# Patient Record
Sex: Male | Born: 1985 | Race: White | Hispanic: No | Marital: Single | Smoking: Never smoker
Health system: Southern US, Community
[De-identification: ages and names within clinical notes are randomized; demographics above are authoritative.]

## PROBLEM LIST (undated history)

## (undated) DIAGNOSIS — I10 Essential (primary) hypertension: Secondary | ICD-10-CM

## (undated) DIAGNOSIS — E7800 Pure hypercholesterolemia, unspecified: Secondary | ICD-10-CM

## (undated) DIAGNOSIS — L409 Psoriasis, unspecified: Secondary | ICD-10-CM

## (undated) HISTORY — PX: ORIF CLAVICLE FRACTURE: SUR924

---

## 2005-11-10 ENCOUNTER — Emergency Department: Payer: Self-pay | Admitting: Emergency Medicine

## 2005-11-19 ENCOUNTER — Ambulatory Visit: Payer: Self-pay | Admitting: Orthopaedic Surgery

## 2006-02-23 ENCOUNTER — Ambulatory Visit: Payer: Self-pay | Admitting: Orthopaedic Surgery
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
## 2014-12-15 DIAGNOSIS — I10 Essential (primary) hypertension: Secondary | ICD-10-CM | POA: Insufficient documentation

## 2017-10-16 ENCOUNTER — Other Ambulatory Visit: Payer: Self-pay

## 2017-10-16 ENCOUNTER — Ambulatory Visit
Admission: EM | Admit: 2017-10-16 | Discharge: 2017-10-16 | Disposition: A | Discharge status: Home / Self Care | Payer: Managed Care, Other (non HMO) | Attending: Family Medicine | Admitting: Family Medicine

## 2017-10-16 DIAGNOSIS — H6123 Impacted cerumen, bilateral: Secondary | ICD-10-CM

## 2017-10-16 DIAGNOSIS — H6691 Otitis media, unspecified, right ear: Secondary | ICD-10-CM

## 2017-10-16 DIAGNOSIS — J029 Acute pharyngitis, unspecified: Secondary | ICD-10-CM | POA: Diagnosis not present

## 2017-10-16 DIAGNOSIS — R0981 Nasal congestion: Secondary | ICD-10-CM

## 2017-10-16 HISTORY — DX: Psoriasis, unspecified: L40.9

## 2017-10-16 LAB — RAPID STREP SCREEN (MED CTR MEBANE ONLY): STREPTOCOCCUS, GROUP A SCREEN (DIRECT): NEGATIVE

## 2017-10-16 MED ORDER — AMOXICILLIN 875 MG PO TABS: 875.0000 mg | ORAL_TABLET | Freq: Two times a day (BID) | ORAL | 0 refills | Status: DC

## 2017-10-16 NOTE — Discharge Instructions (Signed)
Take medication as prescribed. Rest. Drink plenty of fluids.  ° °Follow up with your primary care physician this week as needed. Return to Urgent care for new or worsening concerns.  ° °

## 2017-10-16 NOTE — ED Triage Notes (Signed)
Pt with congestion x one week. He has noticed around 5:00 each evening, he gets a bad sore throat and right ear pain. No fever.

## 2017-10-16 NOTE — ED Provider Notes (Signed)
[facility] ____________________________________________  Time seen: Approximately 8:44 AM  I have reviewed the triage vital signs and the nursing notes.   HISTORY  Chief Complaint Otalgia and Sore Throat  HPI Jason Mcdonald is a 32 y.o. male presented for evaluation of 10 days of runny nose and nasal drainage with onset of sore throat and some right ear discomfort.  States sore throat this past Monday was moderate to severe, and sore throat currently is more mild.  Denies known sick contacts.  Reports right ear still with some pain now. Reports does have some seasonal allergies at baseline.  Has not been taking any allergy medication, has taken intermittent DayQuil and NyQuil.  Denies chest congestion.  States occasional cough.  Denies known fevers.  Overall continues to eat and drink.  Denies other aggravating or alleviating factors. Denies chest pain, shortness of breath, abdominal pain, or rash. Denies recent sickness. Denies recent antibiotic use.    Past Medical History:  Diagnosis Date  . Psoriasis     There are no active problems to display for this patient.   History reviewed. No pertinent surgical history.   No current facility-administered medications for this encounter.   Current Outpatient Medications:  .  amoxicillin (AMOXIL) 875 MG tablet, Take 1 tablet (875 mg total) by mouth 2 (two) times daily., Disp: 20 tablet, Rfl: 0 .  STELARA 90 MG/ML SOSY injection, , Disp: , Rfl:   Allergies Patient has no known allergies.  History reviewed. No pertinent family history.  Social History Social History   Tobacco Use  . Smoking status: Never Smoker  . Smokeless tobacco: Never Used  Substance Use Topics  . Alcohol use: Not Currently  . Drug use: Never    Review of Systems Constitutional: As above.  ENT: As above.  Cardiovascular: Denies chest pain. Respiratory: Denies shortness of breath. Gastrointestinal: No abdominal pain.  No nausea, no  vomiting.  No diarrhea.   Genitourinary: Negative for dysuria. Musculoskeletal: Negative for back pain. Skin: Negative for rash.  ____________________________________________   PHYSICAL EXAM:  VITAL SIGNS: ED Triage Vitals  Enc Vitals Group     BP 10/16/17 0815 (!) 152/94     Pulse Rate 10/16/17 0815 92     Resp 10/16/17 0815 18     Temp 10/16/17 0815 98.2 F (36.8 C)     Temp Source 10/16/17 0815 Oral     SpO2 10/16/17 0815 98 %     Weight 10/16/17 0817 220 lb (99.8 kg)     Height 10/16/17 0817 5\' 10"  (1.778 m)     Head Circumference --      Peak Flow --      Pain Score 10/16/17 0817 1     Pain Loc --      Pain Edu? --      Excl. in [place]? --    Constitutional: Alert and oriented. Well appearing and in no acute distress. Eyes: Conjunctivae are normal.  Head: Atraumatic. No sinus tenderness to palpation. No swelling. No erythema.  Ears: Left: Nontender, total cerumen impaction, post cerumen removal, normal canal, no erythema normal TM.  Right: Nontender, total cerumen impaction, post cerumen removal, canal clear, moderate TM erythema and dullness. No surrounding tenderness, swelling or erythema bilaterally.   Nose:Mild nasal congestion  Mouth/Throat: Mucous membranes are moist. Mild to moderate pharyngeal erythema. No tonsillar swelling or exudate.  Neck: No stridor.  No cervical spine tenderness to palpation. Hematological/Lymphatic/Immunilogical: No cervical lymphadenopathy. Cardiovascular: Normal rate, regular rhythm. Grossly  normal heart sounds.  Good peripheral circulation. Respiratory: Normal respiratory effort.  No retractions. No wheezes, rales or rhonchi. Good air movement.  Musculoskeletal: Ambulatory with steady gait.  Neurologic:  Normal speech and language. No gait instability. Skin:  Skin appears warm, dry and intact. No rash noted. Psychiatric: Mood and affect are normal. Speech and behavior are normal.  ___________________________________________    LABS (all labs ordered are listed, but only abnormal results are displayed)  Labs Reviewed  RAPID STREP SCREEN ([facility] ONLY)  CULTURE, GROUP A STREP [facility])   ____________________________________________  PROCEDURES Procedures  Bilateral cerumen impaction noted.  Wax is removed by curette removal and irrigation.. Instructions for home care to prevent wax buildup are given.   INITIAL IMPRESSION / ASSESSMENT AND PLAN / ED COURSE  Pertinent labs & imaging results that were available during my care of the patient were reviewed by me and considered in my medical decision making (see chart for details).  Well-appearing patient.  No acute distress.  Cerumen impaction bilaterally, removed with irrigation and curette.  Quick strep negative, will culture. Right otitis media will treat with oral amoxicillin. Encouraged rest, fluids and supportive care. Discussed indication, risks and benefits of medications with patient.  Discussed follow up with Primary care physician this week. Discussed follow up and return parameters including no resolution or any worsening concerns. Patient verbalized understanding and agreed to plan.   ____________________________________________   FINAL CLINICAL IMPRESSION(S) / ED DIAGNOSES  Final diagnoses:  Right otitis media, unspecified otitis media type  Pharyngitis, unspecified etiology  Nasal congestion  Bilateral impacted cerumen     ED Discharge Orders        Ordered    amoxicillin (AMOXIL) 875 MG tablet  2 times daily     10/16/17 4970       Note: This dictation was prepared with Dragon dictation along with smaller phrase technology. Any transcriptional errors that result from this process are unintentional.         Marylene Land, NP 10/16/17 1244

## 2017-10-18 LAB — CULTURE, GROUP A STREP (THRC)

## 2019-02-02 ENCOUNTER — Other Ambulatory Visit: Payer: Self-pay

## 2019-02-02 ENCOUNTER — Ambulatory Visit
Admission: EM | Admit: 2019-02-02 | Discharge: 2019-02-02 | Disposition: A | Discharge status: Home / Self Care | Payer: Managed Care, Other (non HMO) | Attending: Family Medicine | Admitting: Family Medicine

## 2019-02-02 ENCOUNTER — Encounter: Payer: Self-pay | Admitting: Emergency Medicine

## 2019-02-02 DIAGNOSIS — R1030 Lower abdominal pain, unspecified: Secondary | ICD-10-CM

## 2019-02-02 DIAGNOSIS — R197 Diarrhea, unspecified: Secondary | ICD-10-CM

## 2019-02-02 LAB — CBC WITH DIFFERENTIAL/PLATELET
Abs Immature Granulocytes: 0.05 10*3/uL (ref 0.00–0.07)
Basophils Absolute: 0.1 10*3/uL (ref 0.0–0.1)
Basophils Relative: 1 %
Eosinophils Absolute: 0.3 10*3/uL (ref 0.0–0.5)
Eosinophils Relative: 2 %
HCT: 46.2 % (ref 39.0–52.0)
Hemoglobin: 16.1 g/dL (ref 13.0–17.0)
Immature Granulocytes: 0 %
Lymphocytes Relative: 18 %
Lymphs Abs: 2.2 10*3/uL (ref 0.7–4.0)
MCH: 29.7 pg (ref 26.0–34.0)
MCHC: 34.8 g/dL (ref 30.0–36.0)
MCV: 85.1 fL (ref 80.0–100.0)
Monocytes Absolute: 1.1 10*3/uL — ABNORMAL HIGH (ref 0.1–1.0)
Monocytes Relative: 9 %
Neutro Abs: 8.7 10*3/uL — ABNORMAL HIGH (ref 1.7–7.7)
Neutrophils Relative %: 70 %
Platelets: 230 10*3/uL (ref 150–400)
RBC: 5.43 MIL/uL (ref 4.22–5.81)
RDW: 11.9 % (ref 11.5–15.5)
WBC: 12.4 10*3/uL — ABNORMAL HIGH (ref 4.0–10.5)
nRBC: 0 % (ref 0.0–0.2)

## 2019-02-02 LAB — COMPREHENSIVE METABOLIC PANEL
ALT: 41 U/L (ref 0–44)
AST: 24 U/L (ref 15–41)
Albumin: 4.3 g/dL (ref 3.5–5.0)
Alkaline Phosphatase: 67 U/L (ref 38–126)
Anion gap: 11 (ref 5–15)
BUN: 14 mg/dL (ref 6–20)
CO2: 22 mmol/L (ref 22–32)
Calcium: 9 mg/dL (ref 8.9–10.3)
Chloride: 100 mmol/L (ref 98–111)
Creatinine, Ser: 0.82 mg/dL (ref 0.61–1.24)
GFR calc Af Amer: >60 mL/min (ref >60)
GFR calc non Af Amer: >60 mL/min (ref >60)
Glucose, Bld: 100 mg/dL — ABNORMAL HIGH (ref 70–99)
Potassium: 3.8 mmol/L (ref 3.5–5.1)
Sodium: 133 mmol/L — ABNORMAL LOW (ref 135–145)
Total Bilirubin: 1.1 mg/dL (ref 0.3–1.2)
Total Protein: 8.2 g/dL — ABNORMAL HIGH (ref 6.5–8.1)

## 2019-02-02 MED ORDER — DIPHENOXYLATE-ATROPINE 2.5-0.025 MG PO TABS: 1.0000 | ORAL_TABLET | Freq: Four times a day (QID) | ORAL | 0 refills | Status: AC | PRN

## 2019-02-02 NOTE — ED Triage Notes (Signed)
Patient states he has had abdominal pain since Monday and has had wateryt diarrhea as well

## 2019-02-02 NOTE — Discharge Instructions (Signed)
Lots of fluids.  Medication as needed.  If persists, would recommend re-evaluation and stool studies.  Take care  Dr. Lacinda Axon

## 2019-02-02 NOTE — ED Provider Notes (Signed)
[facility]    CSN: [id number] Arrival date & time: 02/02/19  1515  History   Chief Complaint Chief Complaint  Patient presents with  . Abdominal Pain   HPI  33 year old male presents with diarrhea and associated abdominal pain.  Started Monday. Reports diarrhea and associated crampy abdominal pain. Reports approximately 2 loose stools and hour with lower abdominal pain. No fever. No medications tried. Eating bland diet currently. No vomiting. Has had some nausea. No recent travel. No recent bad food/dietary changes. No sick contacts at home. No other associated symptoms. No other complaints.  PMH, Surgical Hx, Family Hx, Social History reviewed and updated as below.  Past Medical History:  Diagnosis Date  . Psoriasis    History reviewed. No pertinent surgical history.   Home Medications    Prior to Admission medications   Medication Sig Start Date End Date Taking? Authorizing Provider  diphenoxylate-atropine (LOMOTIL) 2.5-0.025 MG tablet Take 1 tablet by mouth 4 (four) times daily as needed for diarrhea or loose stools. 02/02/19   Coral Spikes, DO  STELARA 90 MG/ML SOSY injection  08/14/17   [provider]    Family History Family History  Problem Relation Age of Onset  . Diabetes Mother     Social History Social History   Tobacco Use  . Smoking status: Never Smoker  . Smokeless tobacco: Never Used  Substance Use Topics  . Alcohol use: Not Currently  . Drug use: Never     Allergies   Patient has no known allergies.   Review of Systems Review of Systems  Constitutional: Negative for fever.  Gastrointestinal: Positive for abdominal pain, diarrhea and nausea.   Physical Exam Triage Vital Signs ED Triage Vitals  Enc Vitals Group     BP 02/02/19 1526 (!) 173/123     Pulse Rate 02/02/19 1526 97     Resp 02/02/19 1526 18     Temp 02/02/19 1526 98.7 F (37.1 C)     Temp Source 02/02/19 1526 Oral     SpO2 02/02/19 1526 96 %   Weight 02/02/19 1524 220 lb (99.8 kg)     Height 02/02/19 1524 5\' 9"  (1.753 m)     Head Circumference --      Peak Flow --      Pain Score 02/02/19 1523 1     Pain Loc --      Pain Edu? --      Excl. in [place]? --    Updated Vital Signs BP (!) 173/123 (BP Location: Left Arm)   Pulse 97   Temp 98.7 F (37.1 C) (Oral)   Resp 18   Ht 5\' 9"  (1.753 m)   Wt 99.8 kg   SpO2 96%   BMI 32.49 kg/m   Visual Acuity Right Eye Distance:   Left Eye Distance:   Bilateral Distance:    Right Eye Near:   Left Eye Near:    Bilateral Near:     Physical Exam Vitals signs and nursing note reviewed.  Constitutional:      General: He is not in acute distress.    Appearance: Normal appearance. He is obese.  HENT:     Head: Normocephalic and atraumatic.     Mouth/Throat:     Mouth: Mucous membranes are moist.     Pharynx: Oropharynx is clear.  Eyes:     General:        Right eye: No discharge.  Left eye: No discharge.     Conjunctiva/sclera: Conjunctivae normal.  Cardiovascular:     Rate and Rhythm: Normal rate and regular rhythm.  Pulmonary:     Effort: Pulmonary effort is normal.     Breath sounds: Normal breath sounds.  Abdominal:     General: There is no distension.     Palpations: Abdomen is soft.     Tenderness: There is no abdominal tenderness.  Neurological:     Mental Status: He is alert.  Psychiatric:        Mood and Affect: Mood normal.        Behavior: Behavior normal.    UC Treatments / Results  Labs (all labs ordered are listed, but only abnormal results are displayed) Labs Reviewed  COMPREHENSIVE METABOLIC PANEL - Abnormal; Notable for the following components:      Result Value   Sodium 133 (*)    Glucose, Bld 100 (*)    Total Protein 8.2 (*)    All other components within normal limits  CBC WITH DIFFERENTIAL/PLATELET - Abnormal; Notable for the following components:   WBC 12.4 (*)    Neutro Abs 8.7 (*)    Monocytes Absolute 1.1 (*)    All other  components within normal limits    EKG   Radiology No results found.  Procedures Procedures (including critical care time)  Medications Ordered in UC Medications - No data to display  Initial Impression / Assessment and Plan / UC Course  I have reviewed the triage vital signs and the nursing notes.  Pertinent labs & imaging results that were available during my care of the patient were reviewed by me and considered in my medical decision making (see chart for details).    33 year old male presents with diarrhea and associated abdominal pain. Labs stable. Likely infectious in origin. No indications for empiric antibiotic therapy at this time. Lomotil as needed. Fluids. Supportive care.  Final Clinical Impressions(s) / UC Diagnoses   Final diagnoses:  Diarrhea of presumed infectious origin  Lower abdominal pain     Discharge Instructions     Lots of fluids.  Medication as needed.  If persists, would recommend re-evaluation and stool studies.  Take care  Dr. Lacinda Axon    ED Prescriptions    Medication Sig Dispense Auth. Provider   diphenoxylate-atropine (LOMOTIL) 2.5-0.025 MG tablet Take 1 tablet by mouth 4 (four) times daily as needed for diarrhea or loose stools. 30 tablet Coral Spikes, DO     Controlled Substance Prescriptions [place] Controlled Substance Registry consulted? Not Applicable   Coral Spikes, DO 02/02/19 2200

## 2019-04-01 DIAGNOSIS — L409 Psoriasis, unspecified: Secondary | ICD-10-CM | POA: Insufficient documentation

## 2019-10-06 ENCOUNTER — Other Ambulatory Visit: Payer: Self-pay

## 2019-10-06 MED ORDER — STELARA 90 MG/ML ~~LOC~~ SOSY: 90.0000 mg | PREFILLED_SYRINGE | SUBCUTANEOUS | 0 refills | Status: DC

## 2019-10-18 ENCOUNTER — Ambulatory Visit (INDEPENDENT_AMBULATORY_CARE_PROVIDER_SITE_OTHER): Payer: BC Managed Care – PPO | Admitting: Dermatology

## 2019-10-18 ENCOUNTER — Other Ambulatory Visit: Payer: Self-pay

## 2019-10-18 DIAGNOSIS — L409 Psoriasis, unspecified: Secondary | ICD-10-CM | POA: Diagnosis not present

## 2019-10-18 MED ORDER — STELARA 90 MG/ML ~~LOC~~ SOSY: 90.0000 mg | PREFILLED_SYRINGE | SUBCUTANEOUS | 1 refills | Status: DC

## 2019-10-18 MED ORDER — STELARA 90 MG/ML ~~LOC~~ SOSY: 90.0000 mg | PREFILLED_SYRINGE | SUBCUTANEOUS | 3 refills | Status: DC

## 2019-10-18 NOTE — Progress Notes (Signed)
   Follow-Up Visit   Subjective  Jason Mcdonald is a 34 y.o. male who presents for the following: Psoriasis (Patient currently using Stelara and Mometasone sol. He has flared over the winter months on the shins and some in the scalp, but otherwise states that he is clear. ).  The following portions of the chart were reviewed this encounter and updated as appropriate: Tobacco  Allergies  Meds  Problems  Med Hx  Surg Hx  Fam Hx     Review of Systems: No other skin or systemic complaints.  Objective  Well appearing patient in no apparent distress; mood and affect are within normal limits.  A focused examination was performed including scalp, arms, B/L leg. Relevant physical exam findings are noted in the Assessment and Plan.  Objective  Scalp, B/L leg, elbows: Thick scale of the scalp. Plaques of the knees and pretibial areas.   Assessment & Plan  Psoriasis with recent flare possibly from cold weather and pandemic Scalp, B/L leg, elbows  BSA 8% - no hx of PsA, he flared over the winter months worse than he has in years. Continue Stelara 90mg /mL SQ Q3M and Mometasone solution to aa's QD-BID 5d/wk PRN flares.   If patient continues to flare while on treatment consider switching to a different biologic medication 9(Ilumya, Jason Mcdonald)  Lab ordered today.  He may get this done by his PCP  QuantiFERON-TB Gold Plus - Scalp, B/L leg, elbows  CBC with Differential/Platelets - Scalp, B/L leg, elbows  CMP - Scalp, B/L leg, elbows  ustekinumab (STELARA) 90 MG/ML SOSY injection - Scalp, B/L leg, elbows  Return in about 6 months (around 04/18/2020).   Luther Redo, CMA, am acting as scribe for [name], MD . Documentation: I have reviewed the above documentation for accuracy and completeness, and I agree with the above.  [name], MD

## 2019-10-19 ENCOUNTER — Encounter: Payer: Self-pay | Admitting: Dermatology

## 2020-03-27 ENCOUNTER — Other Ambulatory Visit: Payer: Self-pay

## 2020-03-27 MED ORDER — STELARA 90 MG/ML ~~LOC~~ SOSY: 90.0000 mg | PREFILLED_SYRINGE | SUBCUTANEOUS | 0 refills | Status: DC

## 2020-03-27 NOTE — Progress Notes (Signed)
Refill of Stelara RX to [facility] for follow up appointment next week.

## 2020-04-05 ENCOUNTER — Ambulatory Visit (INDEPENDENT_AMBULATORY_CARE_PROVIDER_SITE_OTHER): Payer: BC Managed Care – PPO | Admitting: Dermatology

## 2020-04-05 ENCOUNTER — Other Ambulatory Visit: Payer: Self-pay

## 2020-04-05 DIAGNOSIS — L409 Psoriasis, unspecified: Secondary | ICD-10-CM | POA: Diagnosis not present

## 2020-04-05 MED ORDER — CALCIPOTRIENE 0.005 % EX FOAM: CUTANEOUS | 6 refills | Status: DC

## 2020-04-05 MED ORDER — USTEKINUMAB 90 MG/ML ~~LOC~~ SOSY
90.0000 mg | PREFILLED_SYRINGE | Freq: Once | SUBCUTANEOUS | Status: AC
  Administered 2020-04-05: 90 mg via SUBCUTANEOUS

## 2020-04-05 NOTE — Progress Notes (Signed)
   Follow-Up Visit   Subjective  Jason Mcdonald is a 34 y.o. male who presents for the following: Psoriasis. Patient currently using Stelara 90mg /mL SQ Q12W, and Mometasone solution to aa's QD-BID PRN. He has noticed some hypopigmentation at previous areas of psoriasis flares which he would like checked today. Patient states that most summers his psoriasis clears, but this summer he continued to have flares.   The following portions of the chart were reviewed this encounter and updated as appropriate:  Tobacco  Allergies  Meds  Problems  Med Hx  Surg Hx  Fam Hx     Review of Systems:  No other skin or systemic complaints except as noted in HPI or Assessment and Plan.  Objective  Well appearing patient in no apparent distress; mood and affect are within normal limits.  A focused examination was performed including the extremities. Relevant physical exam findings are noted in the Assessment and Plan.  Objective  B/L leg, scalp, and elbows: Well-demarcated erythematous papules/plaques with silvery scale.   Assessment & Plan  Psoriasis -severe on systemic Stelara injections with recent flare of the legs.  Dyschromia of previous psoriasis areas B/L leg, scalp, and elbows No hx of PsA - labs WNL on 10/18/19  Discussed with patient that dyschromia is normal in psoriasis, and coloration will return over time.    Stelara 90mg /mL injected SQ into the R upper arm posterior. Patient tolerated injection well. AL, CMA  Lot number: [id number] Expiration date 09/2022  Continue Stelara 90mg /mL SQ Q12W. Start Calcipotriene foam to aa's QOD alternating with Mometasone solution QOD.   Consider switching to [facility] if patient continues to flare on treatment. Pamphlet given.   ustekinumab (STELARA) injection 90 mg - B/L leg, scalp, and elbows  Calcipotriene 0.005 % FOAM - B/L leg, scalp, and elbows  Other Related Medications ustekinumab (STELARA) 90 MG/ML SOSY injection  Return in about 6  months (around 10/04/2020) for psoriasis follow up .  Luther Redo, CMA, am acting as scribe for [name], MD .  Documentation: I have reviewed the above documentation for accuracy and completeness, and I agree with the above.  [name], MD

## 2020-04-06 ENCOUNTER — Encounter: Payer: Self-pay | Admitting: Dermatology

## 2020-04-10 ENCOUNTER — Telehealth: Payer: Self-pay

## 2020-04-10 NOTE — Telephone Encounter (Signed)
Calcipotriene foam and cream both not covered by pts insurance and would be expensive for pt out of pocket. Is there an alternative we can try to send?

## 2020-04-10 NOTE — Telephone Encounter (Signed)
If none of the forms of Calcipotriene are covered by insurance and if too expensive, there is no alternative. I would recommend he continue use of Mometasone.

## 2020-05-04 ENCOUNTER — Other Ambulatory Visit: Payer: Self-pay | Admitting: Dermatology

## 2020-06-06 ENCOUNTER — Other Ambulatory Visit: Payer: Self-pay | Admitting: Dermatology

## 2020-08-27 ENCOUNTER — Ambulatory Visit (INDEPENDENT_AMBULATORY_CARE_PROVIDER_SITE_OTHER): Payer: BC Managed Care – PPO | Admitting: Internal Medicine

## 2020-08-27 DIAGNOSIS — I10 Essential (primary) hypertension: Secondary | ICD-10-CM

## 2020-08-27 DIAGNOSIS — G4733 Obstructive sleep apnea (adult) (pediatric): Secondary | ICD-10-CM | POA: Diagnosis not present

## 2020-08-27 DIAGNOSIS — E669 Obesity, unspecified: Secondary | ICD-10-CM | POA: Diagnosis not present

## 2020-08-27 DIAGNOSIS — Z7189 Other specified counseling: Secondary | ICD-10-CM | POA: Diagnosis not present

## 2020-08-27 NOTE — Progress Notes (Signed)
[facility] [place], [place] [address]  Pulmonary Sleep Medicine   Office Visit Note  Patient Name: Jason Mcdonald DOB: [birth year] MRN [id number]    Chief Complaint: Obstructive Sleep Apnea visit  Brief History:  Jason Mcdonald is seen today for initial consultation The patient has a 3 month history of sleep apnea. Patient is using PAP nightly. It took a little while to adjust to the CPAP. The patient feels rested after sleeping with PAP.  The patient reports some benefit from PAP use. He was not sleepy before using CPAP but his wife would hear him snore and stop breathing.The Epworth Sleepiness Score is 0 out of 24. The patient does not take naps. The patient complains of the following: some leak  The compliance download shows excellent compliance with an average use time of 6.9 hours. The AHI is 3.7  The patient does not of limb movements disrupting sleep.  ROS  General: (-) fever, (-) chills, (-) night sweat Nose and Sinuses: (-) nasal stuffiness or itchiness, (-) postnasal drip, (-) nosebleeds, (-) sinus trouble. Mouth and Throat: (-) sore throat, (-) hoarseness. Neck: (-) swollen glands, (-) enlarged thyroid, (-) neck pain. Respiratory: - cough, - shortness of breath, - wheezing. Neurologic: - numbness, - tingling. Psychiatric: - anxiety, - depression   Current Medication: Outpatient Encounter Medications as of 08/27/2020  Medication Sig  . amLODipine-valsartan (EXFORGE) 5-160 MG tablet Take 1 tablet by mouth daily.  . Calcipotriene 0.005 % FOAM Apply to aa's psoriasis QOD alternating with Mometasone solution.  . diphenoxylate-atropine (LOMOTIL) 2.5-0.025 MG tablet Take 1 tablet by mouth 4 (four) times daily as needed for diarrhea or loose stools.  . mometasone (ELOCON) 0.1 % lotion APPLY A SMALL AMOUNT TO SKIN TWICE DAILY AS NEEDED  . STELARA 90 MG/ML SOSY injection   . STELARA 90 MG/ML SOSY injection INJECT 1 ML (90 MG TOTAL) UNDER THE SKIN EVERY 12  WEEKS AS DIRECTED FOR MAINTENANCE DOSING  . ustekinumab (STELARA) 90 MG/ML SOSY injection Inject 1 mL (90 mg total) into the skin as directed. Every 12 weeks for maintenance.  . ustekinumab (STELARA) 90 MG/ML SOSY injection Inject 1 mL (90 mg total) into the skin as directed. Every 12 weeks for maintenance.  . ustekinumab (STELARA) 90 MG/ML SOSY injection Inject 1 mL (90 mg total) into the skin as directed. Every 12 weeks for maintenance.   No facility-administered encounter medications on file as of 08/27/2020.    Surgical History: History reviewed. No pertinent surgical history.  Medical History: Past Medical History:  Diagnosis Date  . Psoriasis     Family History: Non contributory to the present illness  Social History: Social History   Socioeconomic History  . Marital status: Single    Spouse name: Not on file  . Number of children: Not on file  . Years of education: Not on file  . Highest education level: Not on file  Occupational History  . Not on file  Tobacco Use  . Smoking status: Never Smoker  . Smokeless tobacco: Never Used  Vaping Use  . Vaping Use: Never used  Substance and Sexual Activity  . Alcohol use: Not Currently  . Drug use: Never  . Sexual activity: Not on file  Other Topics Concern  . Not on file  Social History Narrative  . Not on file   Social Determinants of Health   Financial Resource Strain: Not on file  Food Insecurity: Not on file  Transportation Needs: Not on file  Physical Activity: Not on file  Stress: Not on file  Social Connections: Not on file  Intimate Partner Violence: Not on file    Vital Signs: Blood pressure (!) 158/89, pulse 83, temperature 97.7 F (36.5 C), temperature source Temporal, resp. rate 18, height 5\' 10"  (1.778 m), weight 251 lb (113.9 kg), SpO2 98 %.  Examination: General Appearance: The patient is well-developed, well-nourished, and in no distress. Neck Circumference: 48 Skin: Gross inspection of skin  unremarkable. Head: normocephalic, no gross deformities. Eyes: no gross deformities noted. ENT: ears appear grossly normal Neurologic: Alert and oriented. No involuntary movements.    EPWORTH SLEEPINESS SCALE:  Scale:  (0)= no chance of dozing; (1)= slight chance of dozing; (2)= moderate chance of dozing; (3)= high chance of dozing  Chance  Situtation    Sitting and reading: 0    Watching TV: 0    Sitting Inactive in public: 0    As a passenger in car: 0      Lying down to rest: 0    Sitting and talking: 0    Sitting quielty after lunch: 0    In a car, stopped in traffic: 0   TOTAL SCORE:   0 out of 24    SLEEP STUDIES:  1. HST 06/08/19 - RDI 27.7,  Low SpO2 81%   CPAP COMPLIANCE DATA:  Date Range: 07/28/20-08/26/20  Average Daily Use: 6.9 hours  Median Use: 7.3  Compliance for > 4 Hours: 90%  AHI: 3.7 respiratory events per hour  Days Used: 29/30  Mask Leak: 25.3  95th Percentile Pressure: 13.5         LABS: No results found for this or any previous visit (from the past 2160 hour(s)).  Radiology: No results found.  No results found.  No results found.    Assessment and Plan: Patient Active Problem List   Diagnosis Date Noted  . OSA (obstructive sleep apnea) 08/27/2020  . Essential hypertension 08/27/2020  . Obesity (BMI 35.0-39.9 without comorbidity) 08/27/2020  . Psoriasis 04/01/2019  . Hypertension, uncontrolled 12/15/2014      The patient does tolerate PAP and reports some benefit from PAP use. The patient is caring for his machine as recommended and advised to. The patient was also counselled on the importance of exercise to assist with weight control. The compliance has been excellent and the apnea is controlled  1. OSA (obstructive sleep apnea) continue excellent compliance  2. CPAP use counseling CPAP couseling-Discussed importance of adequate CPAP use as well as proper care and cleaning techniques of machine and all  supplies.  3. Essential hypertension Elevated in office. Continue to monitor and f/u with PCP.  4. Obesity (BMI 35.0-39.9 without comorbidity) Obesity Counseling: Had a lengthy discussion regarding patients BMI and weight issues. Patient was instructed on portion control as well as increased activity. Also discussed caloric restrictions with trying to maintain intake less than 2000 Kcal. Discussions were made in accordance with the 5As of weight management. Simple actions such as not eating late and if able to, taking a walk is suggested.   General Counseling: I have discussed the findings of the evaluation and examination with [facility].  I have also discussed any further diagnostic evaluation thatmay be needed or ordered today. Timon verbalizes understanding of the findings of todays visit. We also reviewed his medications today and discussed drug interactions and side effects including but not limited excessive drowsiness and altered mental states. We also discussed that there is always a risk not just  to him but also people around him. he has been encouraged to call the office with any questions or concerns that should arise related to todays visit.  No orders of the defined types were placed in this encounter.       I have personally obtained a history, examined the patient, evaluated laboratory and imaging results, formulated the assessment and plan and placed orders.  This patient was seen by Drema Dallas, PA-C in collaboration with Dr. Devona Konig as a part of collaborative care agreement.   Richelle Ito Saunders Glance, PhD, FAASM  Diplomate, American Board of Sleep Medicine    Allyne Gee, MD [facility] Diplomate ABMS Pulmonary and Critical Care Medicine Sleep medicine

## 2020-08-27 NOTE — Patient Instructions (Signed)

## 2020-10-04 ENCOUNTER — Ambulatory Visit: Payer: BC Managed Care – PPO | Admitting: Dermatology

## 2020-10-31 ENCOUNTER — Other Ambulatory Visit: Payer: Self-pay

## 2020-10-31 ENCOUNTER — Encounter: Payer: Self-pay | Admitting: Dermatology

## 2020-10-31 ENCOUNTER — Ambulatory Visit (INDEPENDENT_AMBULATORY_CARE_PROVIDER_SITE_OTHER): Payer: BC Managed Care – PPO | Admitting: Dermatology

## 2020-10-31 DIAGNOSIS — L409 Psoriasis, unspecified: Secondary | ICD-10-CM | POA: Diagnosis not present

## 2020-10-31 DIAGNOSIS — Z1283 Encounter for screening for malignant neoplasm of skin: Secondary | ICD-10-CM | POA: Diagnosis not present

## 2020-10-31 DIAGNOSIS — D229 Melanocytic nevi, unspecified: Secondary | ICD-10-CM | POA: Diagnosis not present

## 2020-10-31 DIAGNOSIS — L814 Other melanin hyperpigmentation: Secondary | ICD-10-CM

## 2020-10-31 DIAGNOSIS — B360 Pityriasis versicolor: Secondary | ICD-10-CM | POA: Diagnosis not present

## 2020-10-31 MED ORDER — KETOCONAZOLE 2 % EX SHAM: MEDICATED_SHAMPOO | CUTANEOUS | 6 refills | Status: AC

## 2020-10-31 MED ORDER — STELARA 90 MG/ML ~~LOC~~ SOSY: 90.0000 mg | PREFILLED_SYRINGE | SUBCUTANEOUS | 2 refills | Status: DC

## 2020-10-31 NOTE — Progress Notes (Signed)
   Follow-Up Visit   Subjective  Jason Mcdonald is a 35 y.o. male who presents for the following: Psoriasis (6 month check up. Elbows, scalp, knees. On Stelara. Controlled well. Uses Mometasone BID PRN. No hx of joint pain. On Stelara since 2020. Tolerating well, no injection site reactions or adverse reactions. ) and Rash (Dur: few months. Worse when hot/sweating. Neck. Gets bright pink when hot. Denies itch. Has been using Mometasone cream. ). The patient presents for Upper Body Skin Exam (UBSE) for skin cancer screening and mole check.  The following portions of the chart were reviewed this encounter and updated as appropriate:  Tobacco  Allergies  Meds  Problems  Med Hx  Surg Hx  Fam Hx     Review of Systems: No other skin or systemic complaints except as noted in HPI or Assessment and Plan.  Objective  Well appearing patient in no apparent distress; mood and affect are within normal limits.  A focused examination was performed including head, neck, arms, legs; trunk. Relevant physical exam findings are noted in the Assessment and Plan.  Objective  Right Elbow - Posterior: Guttate plaques at legs, minimal flakes and mild scale at scalp.  Objective  Neck - Anterior: Pink round patches without scale  Assessment & Plan  Psoriasis of the skin; severe on systemic biologic Stelara injections with great results. Right Elbow - Posterior Continue Stelara 90mg /mL SQ Q12W Continue Mometasone cream BID PRN  Other Related Procedures QuantiFERON-TB Gold Plus  Psoriasis - severe on systemic "biologic" treatment injections.  Psoriasis is a chronic non-curable, but treatable genetic/hereditary disease that may have other systemic features affecting other organ systems such as joints (Psoriatic Arthritis).  It is linked with heart disease, inflammatory bowel disease, non-alcoholic fatty liver disease, and depression. Significant skin psoriasis and/or psoriatic arthritis may have  significant symptoms and affects activities of daily activity and often benefits from systemic "biologic" injection treatments.  These "biologic" treatments have some potential side effects including immunosuppression and require pre-treatment laboratory screening and periodic laboratory monitoring and periodic in person evaluation and monitoring by the attending dermatologist physician.  Reordered Medications ustekinumab (STELARA) 90 MG/ML SOSY injection  Tinea versicolor Neck - Anterior Use Ketoconazole shampoo as body wash on neck, arms, torso 3x/week. Lather, leave on 5-10 minutes, rinse well. ketoconazole (NIZORAL) 2 % shampoo - Neck - Anterior  Melanocytic Nevi - Tan-brown and/or pink-flesh-colored symmetric macules and papules at torso - Benign appearing on exam today - Observation - Call clinic for new or changing moles - Recommend daily use of broad spectrum spf 30+ sunscreen to sun-exposed areas.   Lentigines - Scattered tan macules at face, arms, torso - Due to sun exposure - Benign-appering, observe - Recommend daily broad spectrum sunscreen SPF 30+ to sun-exposed areas, reapply every 2 hours as needed. - Call for any changes  Return in about 6 months (around 05/03/2021) for psoriasis recheck.   I, [name], CMA, am acting as scribe for [name], MD.  Documentation: I have reviewed the above documentation for accuracy and completeness, and I agree with the above.  [name], MD

## 2020-10-31 NOTE — Patient Instructions (Signed)
Reviewed risks of biologics including immunosuppression, infections, injection site reaction, and failure to improve condition. Goal is control of skin condition, not cure.  Some older biologics such as Humira and Enbrel may slightly increase risk of malignancy and may worsen congestive heart failure. The use of biologics requires long term medication management, including periodic office visits and monitoring of blood work.   

## 2020-11-02 ENCOUNTER — Encounter: Payer: Self-pay | Admitting: Dermatology

## 2020-12-14 NOTE — Progress Notes (Signed)
[facility] [place], [place] [address]  Pulmonary Sleep Medicine   Office Visit Note  Patient Name: Jason Mcdonald DOB: January 15, 1986 MRN [id number]    Chief Complaint: Obstructive Sleep Apnea visit  Brief History:  Jason Mcdonald is seen today for 4 month follow-up visit  to assure complete adjustment and comfort sleeping on APAP of 4 - 5 cmH2O. The patient has a 7 month history of sleep apnea. Patient is using PAP and feels rested after sleeping with PAP.  The patient reports blood pressure has improved since being on therapy. Reported sleepiness is  improved and the Epworth Sleepiness Score is 2 out of 24. The patient does not take naps. The patient complains of the following still having mask leaks 45.3 LPM small full face mask. Patient has not replaced seal since setup January 2022.  Recommending  monthly seal replacements and sign up for mailout supplies to keep regular replacement schedule.  The compliance download shows  compliance with an average use time of 6:56 hours   @ 92%. The AHI is 1.9  The patient does not complain of limb movements disrupting sleep.  ROS  General: (-) fever, (-) chills, (-) night sweat Nose and Sinuses: (-) nasal stuffiness or itchiness, (-) postnasal drip, (-) nosebleeds, (-) sinus trouble. Mouth and Throat: (-) sore throat, (-) hoarseness. Neck: (-) swollen glands, (-) enlarged thyroid, (-) neck pain. Respiratory: - cough, - shortness of breath, - wheezing. Neurologic: - numbness, - tingling. Psychiatric: - anxiety, - depression   Current Medication: Outpatient Encounter Medications as of 12/17/2020  Medication Sig   diphenoxylate-atropine (LOMOTIL) 2.5-0.025 MG tablet Take 1 tablet by mouth 4 (four) times daily as needed for diarrhea or loose stools.   ketoconazole (NIZORAL) 2 % shampoo 3 times a week lather from neck to thighs, leave on 5-10 minutes, rinse well   mometasone (ELOCON) 0.1 % lotion APPLY A SMALL AMOUNT TO SKIN  TWICE DAILY AS NEEDED   STELARA 90 MG/ML SOSY injection    STELARA 90 MG/ML SOSY injection INJECT 1 ML (90 MG TOTAL) UNDER THE SKIN EVERY 12 WEEKS AS DIRECTED FOR MAINTENANCE DOSING   ustekinumab (STELARA) 90 MG/ML SOSY injection Inject 1 mL (90 mg total) into the skin as directed. Every 12 weeks for maintenance.   ustekinumab (STELARA) 90 MG/ML SOSY injection Inject 1 mL (90 mg total) into the skin as directed. Every 12 weeks for maintenance.   ustekinumab (STELARA) 90 MG/ML SOSY injection Inject 1 mL (90 mg total) into the skin as directed. Every 12 weeks for maintenance.   [DISCONTINUED] amLODipine-valsartan (EXFORGE) 5-160 MG tablet Take 1 tablet by mouth daily.   No facility-administered encounter medications on file as of 12/17/2020.    Surgical History: Past Surgical History:  Procedure Laterality Date   ORIF CLAVICLE FRACTURE      Medical History: Past Medical History:  Diagnosis Date   Psoriasis     Family History: Non contributory to the present illness  Social History: Social History   Socioeconomic History   Marital status: Single    Spouse name: Not on file   Number of children: Not on file   Years of education: Not on file   Highest education level: Not on file  Occupational History   Not on file  Tobacco Use   Smoking status: Never   Smokeless tobacco: Never  Vaping Use   Vaping Use: Never used  Substance and Sexual Activity   Alcohol use: Not Currently   Drug  use: Never   Sexual activity: Not on file  Other Topics Concern   Not on file  Social History Narrative   Not on file   Social Determinants of Health   Financial Resource Strain: Not on file  Food Insecurity: Not on file  Transportation Needs: Not on file  Physical Activity: Not on file  Stress: Not on file  Social Connections: Not on file  Intimate Partner Violence: Not on file    Vital Signs: Blood pressure (!) 141/94, height 5\' 10"  (1.778 m), weight 257 lb (116.6 kg), SpO2 97  %.  Examination: General Appearance: The patient is well-developed, well-nourished, and in no distress. Neck Circumference: 48 Skin: Gross inspection of skin unremarkable. Head: normocephalic, no gross deformities. Eyes: no gross deformities noted. ENT: ears appear grossly normal Neurologic: Alert and oriented. No involuntary movements.    EPWORTH SLEEPINESS SCALE:  Scale:  (0)= no chance of dozing; (1)= slight chance of dozing; (2)= moderate chance of dozing; (3)= high chance of dozing  Chance  Situtation    Sitting and reading: 0    Watching TV: 1    Sitting Inactive in public: 0    As a passenger in car: 0      Lying down to rest: 1    Sitting and talking: 0    Sitting quielty after lunch: 0    In a car, stopped in traffic: 0   TOTAL SCORE:   2 out of 24    SLEEP STUDIES:    HST 06/08/19 - RDI 27.7,  Low SpO2 81%   CPAP COMPLIANCE DATA:  Date Range: 08/15/20 - 12/12/20  Average Daily Use: 6:56 hours  Median Use: 7:22 hours  Compliance for > 4 Hours: 92% days  AHI: 1.9 respiratory events per hour  Days Used: 118/120 days  Mask Leak: 45.3 Lpm  95th Percentile Pressure: 11.3 cmH2O         LABS: No results found for this or any previous visit (from the past 2160 hour(s)).  Radiology: No results found.  No results found.  No results found.    Assessment and Plan: Patient Active Problem List   Diagnosis Date Noted   OSA on CPAP 12/17/2020   CPAP use counseling 12/17/2020   OSA (obstructive sleep apnea) 08/27/2020   Essential hypertension 08/27/2020   Obesity (BMI 35.0-39.9 without comorbidity) 08/27/2020   Psoriasis 04/01/2019   Hypertension, uncontrolled 12/15/2014    1. OSA on CPAP The patient does tolerate PAP and reports definite benefit from PAP use. The patient was reminded how to clean equipment and advised to change supplies routinely. The patient was also counselled on weight loss and exercise. . The compliance is very  good. The AHI is 1.9. OSA- continue very good compliance with pap therapy. F/u one year.    2. CPAP use counseling CPAP Counseling: had a lengthy discussion with the patient regarding the importance of PAP therapy in management of the sleep apnea. Patient appears to understand the risk factor reduction and also understands the risks associated with untreated sleep apnea. Patient will try to make a good faith effort to remain compliant with therapy. Also instructed the patient on proper cleaning of the device including the water must be changed daily if possible and use of distilled water is preferred. Patient understands that the machine should be regularly cleaned with appropriate recommended cleaning solutions that do not damage the PAP machine for example given white vinegar and water rinses. Other methods such as ozone treatment  may not be as good as these simple methods to achieve cleaning.   3. Obesity (BMI 35.0-39.9 without comorbidity) Obesity Counseling: Had a lengthy discussion regarding patients BMI and weight issues. Patient was instructed on portion control as well as increased activity. Also discussed caloric restrictions with trying to maintain intake less than 2000 Kcal. Discussions were made in accordance with the 5As of weight management. Simple actions such as not eating late and if able to, taking a walk is suggested.   4. Essential hypertension Hypertension Counseling:   The following hypertensive lifestyle modification were recommended and discussed:  1. Limiting alcohol intake to less than 1 oz/day of ethanol:(24 oz of beer or 8 oz of wine or 2 oz of 100-proof whiskey). 2. Take baby ASA 81 mg daily. 3. Importance of regular aerobic exercise and losing weight. 4. Reduce dietary saturated fat and cholesterol intake for overall cardiovascular health. 5. Maintaining adequate dietary potassium, calcium, and magnesium intake. 6. Regular monitoring of the blood pressure. 7. Reduce  sodium intake to less than 100 mmol/day (less than 2.3 gm of sodium or less than 6 gm of sodium choride)     He saw his primary care provider today and they adjusted his blood pressure medication.   General Counseling: I have discussed the findings of the evaluation and examination with [facility].  I have also discussed any further diagnostic evaluation thatmay be needed or ordered today. Holston verbalizes understanding of the findings of todays visit. We also reviewed his medications today and discussed drug interactions and side effects including but not limited excessive drowsiness and altered mental states. We also discussed that there is always a risk not just to him but also people around him. he has been encouraged to call the office with any questions or concerns that should arise related to todays visit.  No orders of the defined types were placed in this encounter.       I have personally obtained a history, examined the patient, evaluated laboratory and imaging results, formulated the assessment and plan and placed orders.    This patient was seen today by Tressie Ellis, PA-C in collaboration with Dr. Devona Konig.   Allyne Gee, MD [facility] Diplomate ABMS Pulmonary and Critical Care Medicine Sleep medicine

## 2020-12-17 ENCOUNTER — Ambulatory Visit (INDEPENDENT_AMBULATORY_CARE_PROVIDER_SITE_OTHER): Payer: BC Managed Care – PPO | Admitting: Internal Medicine

## 2020-12-17 VITALS — BP 141/94 | Ht 70.0 in | Wt 257.0 lb

## 2020-12-17 DIAGNOSIS — Z9989 Dependence on other enabling machines and devices: Secondary | ICD-10-CM

## 2020-12-17 DIAGNOSIS — Z7189 Other specified counseling: Secondary | ICD-10-CM | POA: Diagnosis not present

## 2020-12-17 DIAGNOSIS — G4733 Obstructive sleep apnea (adult) (pediatric): Secondary | ICD-10-CM | POA: Diagnosis not present

## 2020-12-17 DIAGNOSIS — I10 Essential (primary) hypertension: Secondary | ICD-10-CM

## 2020-12-17 DIAGNOSIS — E669 Obesity, unspecified: Secondary | ICD-10-CM | POA: Diagnosis not present

## 2020-12-17 NOTE — Patient Instructions (Signed)

## 2020-12-21 DIAGNOSIS — E781 Pure hyperglyceridemia: Secondary | ICD-10-CM | POA: Insufficient documentation

## 2021-05-01 ENCOUNTER — Other Ambulatory Visit: Payer: Self-pay

## 2021-05-01 ENCOUNTER — Encounter: Payer: Self-pay | Admitting: Dermatology

## 2021-05-01 ENCOUNTER — Ambulatory Visit (INDEPENDENT_AMBULATORY_CARE_PROVIDER_SITE_OTHER): Payer: BC Managed Care – PPO | Admitting: Dermatology

## 2021-05-01 DIAGNOSIS — Z1283 Encounter for screening for malignant neoplasm of skin: Secondary | ICD-10-CM | POA: Diagnosis not present

## 2021-05-01 DIAGNOSIS — D1800 Hemangioma unspecified site: Secondary | ICD-10-CM

## 2021-05-01 DIAGNOSIS — L409 Psoriasis, unspecified: Secondary | ICD-10-CM

## 2021-05-01 DIAGNOSIS — D229 Melanocytic nevi, unspecified: Secondary | ICD-10-CM

## 2021-05-01 MED ORDER — STELARA 90 MG/ML ~~LOC~~ SOSY: 90.0000 mg | PREFILLED_SYRINGE | SUBCUTANEOUS | 1 refills | Status: DC

## 2021-05-01 MED ORDER — MOMETASONE FUROATE 0.1 % EX SOLN: CUTANEOUS | 5 refills | Status: AC

## 2021-05-01 NOTE — Progress Notes (Signed)
   Follow-Up Visit   Subjective  Jason Mcdonald is a 35 y.o. male who presents for the following: Psoriasis (Scalp, legs, 63m f/u Stelara sq injections q 12 weeks, Mometasone qd).  The following portions of the chart were reviewed this encounter and updated as appropriate:   Tobacco  Allergies  Meds  Problems  Med Hx  Surg Hx  Fam Hx     Review of Systems:  No other skin or systemic complaints except as noted in HPI or Assessment and Plan.  Objective  Well appearing patient in no apparent distress; mood and affect are within normal limits.  All skin waist up examined.  Scalp, legs Mild scale in scalp   Assessment & Plan   Melanocytic Nevi - Tan-brown and/or pink-flesh-colored symmetric macules and papules - Benign appearing on exam today - Observation - Call clinic for new or changing moles - Recommend daily use of broad spectrum spf 30+ sunscreen to sun-exposed areas.   Hemangiomas - Red papules - Discussed benign nature - Observe - Call for any changes   Psoriasis Scalp, legs  Psoriasis - severe on systemic "biologic" treatment injections.  Psoriasis is a chronic non-curable, but treatable genetic/hereditary disease that may have other systemic features affecting other organ systems such as joints (Psoriatic Arthritis).  It is linked with heart disease, inflammatory bowel disease, non-alcoholic fatty liver disease, and depression. Significant skin psoriasis and/or psoriatic arthritis may have significant symptoms and affects activities of daily activity and often benefits from systemic "biologic" injection treatments.  These "biologic" treatments have some potential side effects including immunosuppression and require pre-treatment laboratory screening and periodic laboratory monitoring and periodic in person evaluation and monitoring by the attending dermatologist physician (long term medication management).   Quantiferon TB gold 12/21/20 Pt does have some joint  stiffness in fingers, does not wake up with joint stiffness, pt just feels like he needs to pop them  Cont Stelara sq injections q 12 wks Cont Mometasone lotion qd up to 5d/wk Recommend restarting tar shampoo, let sit several mins then rinse out, sample of Tgel given to pt  Plan Quantiferon TB gold lab next visit  mometasone (ELOCON) 0.1 % lotion - Scalp, legs Apply topically as directed. Qd up to 5 days a week to aa psoriasis on scalp and legs prn flares  Related Medications ustekinumab (STELARA) 90 MG/ML SOSY injection Inject 1 mL (90 mg total) into the skin as directed. Every 12 weeks for maintenance.  Skin cancer screening  Return in about 6 months (around 10/29/2021) for Psoriasis f/u.  I, Othelia Pulling, RMA, am acting as scribe for [name], MD . Documentation: I have reviewed the above documentation for accuracy and completeness, and I agree with the above.  [name], MD

## 2021-05-01 NOTE — Patient Instructions (Signed)

## 2021-05-04 ENCOUNTER — Encounter: Payer: Self-pay | Admitting: Gynecology

## 2021-05-04 ENCOUNTER — Ambulatory Visit
Admission: EM | Admit: 2021-05-04 | Discharge: 2021-05-04 | Disposition: A | Discharge status: Home / Self Care | Payer: BC Managed Care – PPO | Attending: Emergency Medicine | Admitting: Emergency Medicine

## 2021-05-04 ENCOUNTER — Other Ambulatory Visit: Payer: Self-pay

## 2021-05-04 DIAGNOSIS — J069 Acute upper respiratory infection, unspecified: Secondary | ICD-10-CM

## 2021-05-04 DIAGNOSIS — H66002 Acute suppurative otitis media without spontaneous rupture of ear drum, left ear: Secondary | ICD-10-CM

## 2021-05-04 DIAGNOSIS — Z20822 Contact with and (suspected) exposure to covid-19: Secondary | ICD-10-CM | POA: Diagnosis not present

## 2021-05-04 HISTORY — DX: Essential (primary) hypertension: I10

## 2021-05-04 HISTORY — DX: Pure hypercholesterolemia, unspecified: E78.00

## 2021-05-04 LAB — RAPID INFLUENZA A&B ANTIGENS
Influenza A (ARMC): NEGATIVE
Influenza B (ARMC): NEGATIVE

## 2021-05-04 MED ORDER — BENZONATATE 100 MG PO CAPS: 200.0000 mg | ORAL_CAPSULE | Freq: Three times a day (TID) | ORAL | 0 refills | Status: AC

## 2021-05-04 MED ORDER — IPRATROPIUM BROMIDE 0.06 % NA SOLN: 2.0000 | Freq: Four times a day (QID) | NASAL | 12 refills | Status: AC

## 2021-05-04 MED ORDER — AMOXICILLIN-POT CLAVULANATE 875-125 MG PO TABS: 1.0000 | ORAL_TABLET | Freq: Two times a day (BID) | ORAL | 0 refills | Status: AC

## 2021-05-04 MED ORDER — PROMETHAZINE-DM 6.25-15 MG/5ML PO SYRP: 5.0000 mL | ORAL_SOLUTION | Freq: Four times a day (QID) | ORAL | 0 refills | Status: AC | PRN

## 2021-05-04 NOTE — Discharge Instructions (Signed)
Take the Augmentin twice daily for 10 days with food for treatment of your ear infection.  Take an over-the-counter probiotic 1 hour after each dose of antibiotic to prevent diarrhea.  Use over-the-counter Tylenol and ibuprofen as needed for pain or fever.  Place a hot water bottle, or heating pad, underneath your pillowcase at night to help dilate up your ear and aid in pain relief as well as resolution of the infection.  Use the Atrovent nasal spray, 2 squirts in each nostril every 6 hours, as needed for runny nose and postnasal drip.  Use the Tessalon Perles every 8 hours during the day.  Take them with a small sip of water.  They may give you some numbness to the base of your tongue or a metallic taste in your mouth, this is normal.  Use the Promethazine DM cough syrup at bedtime for cough and congestion.  It will make you drowsy so do not take it during the day.  Return for reevaluation or see your primary care provider for any new or worsening symptoms.

## 2021-05-04 NOTE — ED Provider Notes (Signed)
[facility]    CSN: [id number] Arrival date & time: 05/04/21  5027      History   Chief Complaint No chief complaint on file.   HPI Jason Mcdonald is a 35 y.o. male.   HPI  35 year old male here for evaluation of respiratory complaints.  Patient reports that for days ago he developed cold chills which resolved yesterday, runny nose and nasal congestion, ear pain, green nasal discharge, cough that is productive for green sputum, shortness of breath or wheezing, diarrhea, and vomiting which is also resolved.  He denies sore throat or body aches.  He has had an intermittent headache.  Past Medical History:  Diagnosis Date   Hypercholesterolemia    Hypertension    Psoriasis     Patient Active Problem List   Diagnosis Date Noted   OSA on CPAP 12/17/2020   CPAP use counseling 12/17/2020   OSA (obstructive sleep apnea) 08/27/2020   Essential hypertension 08/27/2020   Obesity (BMI 35.0-39.9 without comorbidity) 08/27/2020   Psoriasis 04/01/2019   Hypertension, uncontrolled 12/15/2014    Past Surgical History:  Procedure Laterality Date   ORIF CLAVICLE FRACTURE         Home Medications    Prior to Admission medications   Medication Sig Start Date End Date Taking? Authorizing Provider  amoxicillin-clavulanate (AUGMENTIN) 875-125 MG tablet Take 1 tablet by mouth every 12 (twelve) hours for 10 days. 05/04/21 05/14/21 Yes Margarette Canada, NP  benzonatate (TESSALON) 100 MG capsule Take 2 capsules (200 mg total) by mouth every 8 (eight) hours. 05/04/21  Yes Margarette Canada, NP  fenofibrate (TRICOR) 48 MG tablet Take by mouth. 12/21/20 12/21/21 Yes [provider]  ipratropium (ATROVENT) 0.06 % nasal spray Place 2 sprays into both nostrils 4 (four) times daily. 05/04/21  Yes Margarette Canada, NP  promethazine-dextromethorphan (PROMETHAZINE-DM) 6.25-15 MG/5ML syrup Take 5 mLs by mouth 4 (four) times daily as needed. 05/04/21  Yes Margarette Canada, NP  STELARA 90 MG/ML SOSY  injection INJECT 1 ML (90 MG TOTAL) UNDER THE SKIN EVERY 12 WEEKS AS DIRECTED FOR MAINTENANCE DOSING 06/06/20  Yes Ralene Bathe, MD  amLODipine-valsartan (EXFORGE) 10-320 MG tablet Take 1 tablet by mouth daily. 02/14/21   [provider]  diphenoxylate-atropine (LOMOTIL) 2.5-0.025 MG tablet Take 1 tablet by mouth 4 (four) times daily as needed for diarrhea or loose stools. 02/02/19   Coral Spikes, DO  ketoconazole (NIZORAL) 2 % shampoo 3 times a week lather from neck to thighs, leave on 5-10 minutes, rinse well 10/31/20   Ralene Bathe, MD  mometasone (ELOCON) 0.1 % lotion Apply topically as directed. Qd up to 5 days a week to aa psoriasis on scalp and legs prn flares 05/01/21   Ralene Bathe, MD  STELARA 90 MG/ML SOSY injection  08/14/17   [provider]  ustekinumab (STELARA) 90 MG/ML SOSY injection Inject 1 mL (90 mg total) into the skin as directed. Every 12 weeks for maintenance. 10/06/19   Ralene Bathe, MD  ustekinumab (STELARA) 90 MG/ML SOSY injection Inject 1 mL (90 mg total) into the skin as directed. Every 12 weeks for maintenance. 10/18/19   Ralene Bathe, MD  ustekinumab (STELARA) 90 MG/ML SOSY injection Inject 1 mL (90 mg total) into the skin as directed. Every 12 weeks for maintenance. 05/01/21   Ralene Bathe, MD    Family History Family History  Problem Relation Age of Onset   Diabetes Mother     Social History  Social History   Tobacco Use   Smoking status: Never   Smokeless tobacco: Never  Vaping Use   Vaping Use: Never used  Substance Use Topics   Alcohol use: Not Currently   Drug use: Never     Allergies   Patient has no known allergies.   Review of Systems Review of Systems  Constitutional:  Positive for chills, diaphoresis and fever. Negative for activity change and appetite change.  HENT:  Positive for congestion, ear pain and rhinorrhea. Negative for sore throat.   Respiratory:  Positive for cough, shortness of breath and  wheezing.   Gastrointestinal:  Positive for diarrhea, nausea and vomiting.  Musculoskeletal:  Negative for arthralgias and myalgias.  Skin:  Negative for rash.  Neurological:  Positive for headaches.  Hematological: Negative.   Psychiatric/Behavioral: Negative.      Physical Exam Triage Vital Signs ED Triage Vitals  Enc Vitals Group     BP 05/04/21 0854 (!) 135/101     Pulse Rate 05/04/21 0854 (!) 108     Resp 05/04/21 0854 16     Temp 05/04/21 0854 99.2 F (37.3 C)     Temp Source 05/04/21 0854 Oral     SpO2 05/04/21 0854 98 %     Weight 05/04/21 0853 250 lb (113.4 kg)     Height 05/04/21 0853 5\' 9"  (1.753 m)     Head Circumference --      Peak Flow --      Pain Score 05/04/21 0853 3     Pain Loc --      Pain Edu? --      Excl. in [place]? --    No data found.  Updated Vital Signs BP (!) 135/101 (BP Location: Left Arm)   Pulse (!) 108   Temp 99.2 F (37.3 C) (Oral)   Resp 16   Ht 5\' 9"  (1.753 m)   Wt 250 lb (113.4 kg)   SpO2 98%   BMI 36.92 kg/m   Visual Acuity Right Eye Distance:   Left Eye Distance:   Bilateral Distance:    Right Eye Near:   Left Eye Near:    Bilateral Near:     Physical Exam Vitals and nursing note reviewed.  Constitutional:      General: He is not in acute distress.    Appearance: Normal appearance. He is not ill-appearing.  HENT:     Head: Normocephalic and atraumatic.     Right Ear: Tympanic membrane, ear canal and external ear normal. There is no impacted cerumen.     Left Ear: Ear canal and external ear normal.     Ears:     Comments: Left TM erythematous and injected with loss of landmarks.    Nose: Congestion and rhinorrhea present.     Mouth/Throat:     Mouth: Mucous membranes are moist.     Pharynx: Oropharynx is clear. Posterior oropharyngeal erythema present.  Cardiovascular:     Rate and Rhythm: Normal rate and regular rhythm.     Pulses: Normal pulses.     Heart sounds: Normal heart sounds. No murmur heard.   No  gallop.  Pulmonary:     Effort: Pulmonary effort is normal.     Breath sounds: Normal breath sounds. No wheezing, rhonchi or rales.  Musculoskeletal:     Cervical back: Normal range of motion and neck supple. No tenderness.  Lymphadenopathy:     Cervical: No cervical adenopathy.  Skin:    General: Skin is  warm and dry.     Capillary Refill: Capillary refill takes less than 2 seconds.     Findings: No erythema or rash.  Neurological:     General: No focal deficit present.     Mental Status: He is alert and oriented to person, place, and time.  Psychiatric:        Mood and Affect: Mood normal.        Behavior: Behavior normal.        Thought Content: Thought content normal.        Judgment: Judgment normal.     UC Treatments / Results  Labs (all labs ordered are listed, but only abnormal results are displayed) Labs Reviewed  SARS CORONAVIRUS 2 (TAT 6-24 HRS)  RAPID INFLUENZA A&B ANTIGENS    EKG   Radiology No results found.  Procedures Procedures (including critical care time)  Medications Ordered in UC Medications - No data to display  Initial Impression / Assessment and Plan / UC Course  I have reviewed the triage vital signs and the nursing notes.  Pertinent labs & imaging results that were available during my care of the patient were reviewed by me and considered in my medical decision making (see chart for details).  Patient is a nontoxic-appearing 35 year old male here for evaluation of respiratory complaints as outlined in the HPI above.  Patient's physical exam reveals an erythematous and injected left tympanic membrane with loss of landmarks.  The external auditory canal is clear.  Right TM is pearly gray with normal light reflex and clear EAC as well.  These mucosa is erythematous and edematous with scant clear nasal discharge in both nares.  Oropharyngeal exam reveals very mild posterior oropharyngeal erythema with clear postnasal drip.  No cervical  lymphadenopathy appreciated on exam.  Cardiopulmonary exam shows clear lung sounds in all fields.  Patient was swabbed for COVID and influenza at triage.  Patient symptoms have been ongoing for 4 days and he is outside the therapeutic window for antivirals for influenza if he is positive.  He is nearly outside of the window for antiviral therapy if his COVID is positive.  I will treat the otitis media in the left ear with Augmentin twice daily for 10 days and treat his respiratory symptoms with Atrovent nasal spray to help with the nasal congestion postnasal drip, Tessalon Perles for cough during the day and Promethazine DM for cough at bedtime.  ER and return precautions reviewed with patient.   Final Clinical Impressions(s) / UC Diagnoses   Final diagnoses:  Acute upper respiratory infection  Non-recurrent acute suppurative otitis media of left ear without spontaneous rupture of tympanic membrane     Discharge Instructions      Take the Augmentin twice daily for 10 days with food for treatment of your ear infection.  Take an over-the-counter probiotic 1 hour after each dose of antibiotic to prevent diarrhea.  Use over-the-counter Tylenol and ibuprofen as needed for pain or fever.  Place a hot water bottle, or heating pad, underneath your pillowcase at night to help dilate up your ear and aid in pain relief as well as resolution of the infection.  Use the Atrovent nasal spray, 2 squirts in each nostril every 6 hours, as needed for runny nose and postnasal drip.  Use the Tessalon Perles every 8 hours during the day.  Take them with a small sip of water.  They may give you some numbness to the base of your tongue or a metallic taste  in your mouth, this is normal.  Use the Promethazine DM cough syrup at bedtime for cough and congestion.  It will make you drowsy so do not take it during the day.  Return for reevaluation or see your primary care provider for any new or worsening symptoms.        ED Prescriptions     Medication Sig Dispense Auth. Provider   amoxicillin-clavulanate (AUGMENTIN) 875-125 MG tablet Take 1 tablet by mouth every 12 (twelve) hours for 10 days. 20 tablet Margarette Canada, NP   benzonatate (TESSALON) 100 MG capsule Take 2 capsules (200 mg total) by mouth every 8 (eight) hours. 21 capsule Margarette Canada, NP   ipratropium (ATROVENT) 0.06 % nasal spray Place 2 sprays into both nostrils 4 (four) times daily. 15 mL Margarette Canada, NP   promethazine-dextromethorphan (PROMETHAZINE-DM) 6.25-15 MG/5ML syrup Take 5 mLs by mouth 4 (four) times daily as needed. 118 mL Margarette Canada, NP      PDMP not reviewed this encounter.   Margarette Canada, NP 05/04/21 [phone]

## 2021-05-04 NOTE — ED Triage Notes (Signed)
Patient c/o cold chills / cough / congestion x 4 days.. Per patient deny any fever or sore throat.

## 2021-05-05 LAB — SARS CORONAVIRUS 2 (TAT 6-24 HRS): SARS Coronavirus 2: NEGATIVE

## 2021-07-25 ENCOUNTER — Other Ambulatory Visit: Payer: Self-pay

## 2021-07-25 ENCOUNTER — Encounter: Payer: Self-pay | Admitting: Dermatology

## 2021-07-25 DIAGNOSIS — L409 Psoriasis, unspecified: Secondary | ICD-10-CM

## 2021-07-25 MED ORDER — STELARA 90 MG/ML ~~LOC~~ SOSY: 90.0000 mg | PREFILLED_SYRINGE | SUBCUTANEOUS | 1 refills | Status: DC

## 2021-07-25 NOTE — Progress Notes (Signed)
Fax received to send in new RX for Stelara. RX e-scripted. aw

## 2021-11-14 ENCOUNTER — Ambulatory Visit: Payer: BC Managed Care – PPO | Admitting: Dermatology

## 2021-11-27 ENCOUNTER — Ambulatory Visit (INDEPENDENT_AMBULATORY_CARE_PROVIDER_SITE_OTHER): Payer: BC Managed Care – PPO | Admitting: Dermatology

## 2021-11-27 DIAGNOSIS — L820 Inflamed seborrheic keratosis: Secondary | ICD-10-CM

## 2021-11-27 DIAGNOSIS — L409 Psoriasis, unspecified: Secondary | ICD-10-CM

## 2021-11-27 DIAGNOSIS — L821 Other seborrheic keratosis: Secondary | ICD-10-CM | POA: Diagnosis not present

## 2021-11-27 MED ORDER — CLOBETASOL PROPIONATE 0.05 % EX SOLN: CUTANEOUS | 2 refills | Status: DC

## 2021-11-27 NOTE — Patient Instructions (Addendum)

## 2021-11-27 NOTE — Progress Notes (Signed)
   Follow-Up Visit   Subjective  Jason Mcdonald is a 36 y.o. male who presents for the following: Psoriasis (6 months f/u on psoriasis on the scalp and legs treating with Stelera injection q 12 weeks and Mometasone lotion with a response ). The patient has spots, moles and lesions to be evaluated, some may be new or changing on his forehead.  The following portions of the chart were reviewed this encounter and updated as appropriate:   Tobacco  Allergies  Meds  Problems  Med Hx  Surg Hx  Fam Hx     Review of Systems:  No other skin or systemic complaints except as noted in HPI or Assessment and Plan.  Objective  Well appearing patient in no apparent distress; mood and affect are within normal limits.  A focused examination was performed including face,scalp,legs. Relevant physical exam findings are noted in the Assessment and Plan.  right lateral forehead Stuck-on, waxy, tan-brown papule -- Discussed benign etiology and prognosis.   scalp, legs guttate pink scaly papules.    Assessment & Plan  Inflamed seborrheic keratosis right lateral forehead Symptomatic, irritating, patient would like treated.   Destruction of lesion - right lateral forehead  Destruction method: cryotherapy   Informed consent: discussed and consent obtained   Lesion destroyed using liquid nitrogen: Yes   Region frozen until ice ball extended beyond lesion: Yes   Outcome: patient tolerated procedure well with no complications   Post-procedure details: wound care instructions given    Psoriasis scalp, legs Psoriasis is a chronic non-curable, but treatable genetic/hereditary disease that may have other systemic features affecting other organ systems such as joints (Psoriatic Arthritis). It is associated with an increased risk of inflammatory bowel disease, heart disease, non-alcoholic fatty liver disease, and depression.     Start Clobetasol solution apply to affected skin qd up to 5 days a  week Continue Stelera injection q 12 weeks   Labs ordered CBC with diff, Quantiferon TB gold   Labs reviewed  CMET, Lipid panel dated 06-26-2021 WNL  Related Procedures CBC with Differential/Platelet QuantiFERON-TB Gold Plus  Related Medications mometasone (ELOCON) 0.1 % lotion Apply topically as directed. Qd up to 5 days a week to aa psoriasis on scalp and legs prn flares  ustekinumab (STELARA) 90 MG/ML SOSY injection Inject 1 mL (90 mg total) into the skin as directed. Every 12 weeks for maintenance.  clobetasol (TEMOVATE) 0.05 % external solution Apply to affected skin once a day up to 5 days a week  Seborrheic Keratoses - Stuck-on, waxy, tan-brown papules and/or plaques  - Benign-appearing - Discussed benign etiology and prognosis. - Observe - Call for any changes  Return in about 6 months (around 05/29/2022) for Psoriasis .  IMarye Round, CMA, am acting as scribe for [name], MD .  Documentation: I have reviewed the above documentation for accuracy and completeness, and I agree with the above.  [name], MD

## 2021-12-01 ENCOUNTER — Encounter: Payer: Self-pay | Admitting: Dermatology

## 2021-12-16 ENCOUNTER — Ambulatory Visit (INDEPENDENT_AMBULATORY_CARE_PROVIDER_SITE_OTHER): Payer: BC Managed Care – PPO | Admitting: Internal Medicine

## 2021-12-16 VITALS — BP 158/98 | HR 62 | Resp 16 | Ht 70.0 in | Wt 253.0 lb

## 2021-12-16 DIAGNOSIS — Z7189 Other specified counseling: Secondary | ICD-10-CM | POA: Diagnosis not present

## 2021-12-16 DIAGNOSIS — E669 Obesity, unspecified: Secondary | ICD-10-CM | POA: Diagnosis not present

## 2021-12-16 DIAGNOSIS — I10 Essential (primary) hypertension: Secondary | ICD-10-CM

## 2021-12-16 DIAGNOSIS — G4733 Obstructive sleep apnea (adult) (pediatric): Secondary | ICD-10-CM | POA: Diagnosis not present

## 2021-12-16 DIAGNOSIS — Z9989 Dependence on other enabling machines and devices: Secondary | ICD-10-CM

## 2021-12-16 NOTE — Patient Instructions (Signed)

## 2021-12-16 NOTE — Progress Notes (Signed)
[facility] [place], [place] [address]  Pulmonary Sleep Medicine   Office Visit Note  Patient Name: Jason Mcdonald DOB: July 09, 1985 MRN [id number]    Chief Complaint: Obstructive Sleep Apnea visit  Brief History:  Saber is seen today for follow up. The patient has a 1 year, 7 month history of sleep apnea. Patient is using PAP nightly small F30 full face mask.  The patient feels great after sleeping with PAP.  Epworth Sleepiness Score is 0 out of 24. The patient does not take naps. The patient complains of the following: no problems  The compliance download shows  compliance with an average use time of 7:26  hours @ 94%. The AHI is 1.2  The patient does not complain of limb movements disrupting sleep.  ROS  General: (-) fever, (-) chills, (-) night sweat Nose and Sinuses: (-) nasal stuffiness or itchiness, (-) postnasal drip, (-) nosebleeds, (-) sinus trouble. Mouth and Throat: (-) sore throat, (-) hoarseness. Neck: (-) swollen glands, (-) enlarged thyroid, (-) neck pain. Respiratory: - cough, - shortness of breath, - wheezing. Neurologic: - numbness, - tingling. Psychiatric: - anxiety, - depression   Current Medication: Outpatient Encounter Medications as of 12/16/2021  Medication Sig   amLODipine-valsartan (EXFORGE) 10-320 MG tablet Take 1 tablet by mouth daily.   benzonatate (TESSALON) 100 MG capsule Take 2 capsules (200 mg total) by mouth every 8 (eight) hours.   clobetasol (TEMOVATE) 0.05 % external solution Apply to affected skin once a day up to 5 days a week   diphenoxylate-atropine (LOMOTIL) 2.5-0.025 MG tablet Take 1 tablet by mouth 4 (four) times daily as needed for diarrhea or loose stools.   fenofibrate (TRICOR) 48 MG tablet Take by mouth.   ipratropium (ATROVENT) 0.06 % nasal spray Place 2 sprays into both nostrils 4 (four) times daily.   ketoconazole (NIZORAL) 2 % shampoo 3 times a week lather from neck to thighs, leave on 5-10 minutes,  rinse well   mometasone (ELOCON) 0.1 % lotion Apply topically as directed. Qd up to 5 days a week to aa psoriasis on scalp and legs prn flares   promethazine-dextromethorphan (PROMETHAZINE-DM) 6.25-15 MG/5ML syrup Take 5 mLs by mouth 4 (four) times daily as needed.   ustekinumab (STELARA) 90 MG/ML SOSY injection Inject 1 mL (90 mg total) into the skin as directed. Every 12 weeks for maintenance.   No facility-administered encounter medications on file as of 12/16/2021.    Surgical History: Past Surgical History:  Procedure Laterality Date   ORIF CLAVICLE FRACTURE      Medical History: Past Medical History:  Diagnosis Date   Hypercholesterolemia    Hypertension    Psoriasis     Family History: Non contributory to the present illness  Social History: Social History   Socioeconomic History   Marital status: Single    Spouse name: Not on file   Number of children: Not on file   Years of education: Not on file   Highest education level: Not on file  Occupational History   Not on file  Tobacco Use   Smoking status: Never   Smokeless tobacco: Never  Vaping Use   Vaping Use: Never used  Substance and Sexual Activity   Alcohol use: Not Currently   Drug use: Never   Sexual activity: Not on file  Other Topics Concern   Not on file  Social History Narrative   Not on file   Social Determinants of Health   Financial Resource  Strain: Not on file  Food Insecurity: Not on file  Transportation Needs: Not on file  Physical Activity: Not on file  Stress: Not on file  Social Connections: Not on file  Intimate Partner Violence: Not on file    Vital Signs: Blood pressure (!) 158/98, pulse 62, resp. rate 16, height '5\' 10"'$  (1.778 m), weight 253 lb (114.8 kg), SpO2 98 %. Body mass index is 36.3 kg/m.    Examination: General Appearance: The patient is well-developed, well-nourished, and in no distress. Neck Circumference: 47cm Skin: Gross inspection of skin  unremarkable. Head: normocephalic, no gross deformities. Eyes: no gross deformities noted. ENT: ears appear grossly normal Neurologic: Alert and oriented. No involuntary movements.    EPWORTH SLEEPINESS SCALE:  Scale:  (0)= no chance of dozing; (1)= slight chance of dozing; (2)= moderate chance of dozing; (3)= high chance of dozing  Chance  Situtation    Sitting and reading: 0    Watching TV: 0    Sitting Inactive in public: 0    As a passenger in car: 0      Lying down to rest: 0    Sitting and talking: 0    Sitting quielty after lunch: 0    In a car, stopped in traffic: 0   TOTAL SCORE:   0 out of 24    SLEEP STUDIES:  HST 06/08/19 - RDI 27.7,  Low SpO2 81%   CPAP COMPLIANCE DATA:  Date Range:   12/10/20  - 12/09/21  Average Daily Use: 7:26 hours  Median Use: 7:50 hours  Compliance for > 4 Hours: 94%   AHI: 1.2 respiratory events per hour  Days Used: 353/365  Mask Leak: 42 lpm  95th Percentile Pressure: 10.3 cmH2O    LABS: No results found for this or any previous visit (from the past 2160 hour(s)).  Radiology: No results found.  No results found.  No results found.    Assessment and Plan: Patient Active Problem List   Diagnosis Date Noted   OSA on CPAP 12/17/2020   CPAP use counseling 12/17/2020   OSA (obstructive sleep apnea) 08/27/2020   Essential hypertension 08/27/2020   Obesity (BMI 35.0-39.9 without comorbidity) 08/27/2020   Psoriasis 04/01/2019   Hypertension, uncontrolled 12/15/2014   1. OSA on CPAP The patient does tolerate PAP and reports  benefit from PAP use. The patient was reminded how to clean equipment and advised to replace supplies routinely. The patient was also counselled on clean equipment. The compliance is very good. The AHI is 1.2.   OSA on cpap- CPAP continues to be medically necessary to treat this patient's OSA.  Continue with very good compliance. F/u one year.    2. CPAP use counseling CPAP  Counseling: had a lengthy discussion with the patient regarding the importance of PAP therapy in management of the sleep apnea. Patient appears to understand the risk factor reduction and also understands the risks associated with untreated sleep apnea. Patient will try to make a good faith effort to remain compliant with therapy. Also instructed the patient on proper cleaning of the device including the water must be changed daily if possible and use of distilled water is preferred. Patient understands that the machine should be regularly cleaned with appropriate recommended cleaning solutions that do not damage the PAP machine for example given white vinegar and water rinses. Other methods such as ozone treatment may not be as good as these simple methods to achieve cleaning.   3. Obesity (BMI 35.0-39.9 without  comorbidity) Obesity Counseling: Had a lengthy discussion regarding patients BMI and weight issues. Patient was instructed on portion control as well as increased activity. Also discussed caloric restrictions with trying to maintain intake less than 2000 Kcal. Discussions were made in accordance with the 5As of weight management. Simple actions such as not eating late and if able to, taking a walk is suggested.   4. Essential hypertension Hypertension Counseling:   The following hypertensive lifestyle modification were recommended and discussed:  1. Limiting alcohol intake to less than 1 oz/day of ethanol:(24 oz of beer or 8 oz of wine or 2 oz of 100-proof whiskey). 2. Take baby ASA 81 mg daily. 3. Importance of regular aerobic exercise and losing weight. 4. Reduce dietary saturated fat and cholesterol intake for overall cardiovascular health. 5. Maintaining adequate dietary potassium, calcium, and magnesium intake. 6. Regular monitoring of the blood pressure. 7. Reduce sodium intake to less than 100 mmol/day (less than 2.3 gm of sodium or less than 6 gm of sodium choride)      General  Counseling: I have discussed the findings of the evaluation and examination with [place].  I have also discussed any further diagnostic evaluation thatmay be needed or ordered today. Baran verbalizes understanding of the findings of todays visit. We also reviewed his medications today and discussed drug interactions and side effects including but not limited excessive drowsiness and altered mental states. We also discussed that there is always a risk not just to him but also people around him. he has been encouraged to call the office with any questions or concerns that should arise related to todays visit.  No orders of the defined types were placed in this encounter.       I have personally obtained a history, examined the patient, evaluated laboratory and imaging results, formulated the assessment and plan and placed orders. This patient was seen today by Tressie Ellis, PA-C in collaboration with Dr. Devona Konig.   Allyne Gee, MD [facility] Diplomate ABMS Pulmonary Critical Care Medicine and Sleep Medicine

## 2022-01-01 ENCOUNTER — Other Ambulatory Visit: Payer: Self-pay | Admitting: Dermatology

## 2022-01-01 DIAGNOSIS — L409 Psoriasis, unspecified: Secondary | ICD-10-CM

## 2022-06-04 ENCOUNTER — Ambulatory Visit (INDEPENDENT_AMBULATORY_CARE_PROVIDER_SITE_OTHER): Payer: BC Managed Care – PPO | Admitting: Dermatology

## 2022-06-04 DIAGNOSIS — Z79899 Other long term (current) drug therapy: Secondary | ICD-10-CM

## 2022-06-04 DIAGNOSIS — L409 Psoriasis, unspecified: Secondary | ICD-10-CM | POA: Diagnosis not present

## 2022-06-04 MED ORDER — ZORYVE 0.3 % EX CREA: TOPICAL_CREAM | CUTANEOUS | 6 refills | Status: DC

## 2022-06-04 MED ORDER — CLOBETASOL PROPIONATE 0.05 % EX SOLN: CUTANEOUS | 6 refills | Status: AC

## 2022-06-04 NOTE — Patient Instructions (Addendum)
[place], [place] - [phone], [place]  [address] Phone: [phone]  Fax: [phone]     Due to recent changes in healthcare laws, you may see results of your pathology and/or laboratory studies on MyChart before the doctors have had a chance to review them. We understand that in some cases there may be results that are confusing or concerning to you. Please understand that not all results are received at the same time and often the doctors may need to interpret multiple results in order to provide you with the best plan of care or course of treatment. Therefore, we ask that you please give [place] 2 business days to thoroughly review all your results before contacting the office for clarification. Should we see a critical lab result, you will be contacted sooner.   If You Need Anything After Your Visit  If you have any questions or concerns for your doctor, please call our main line at [phone] and press option 4 to reach your doctor's medical assistant. If no one answers, please leave a voicemail as directed and we will return your call as soon as possible. Messages left after 4 pm will be answered the following business day.   You may also send [place] a message via [place]. We typically respond to MyChart messages within 1-2 business days.  For prescription refills, please ask your pharmacy to contact our office. Our fax number is [phone].  If you have an urgent issue when the clinic is closed that cannot wait until the next business day, you can page your doctor at the number below.    Please note that while we do our best to be available for urgent issues outside of office hours, we are not available 24/7.   If you have an urgent issue and are unable to reach [place], you may choose to seek medical care at your doctor's office, retail clinic, urgent care center, or emergency room.  If you have a medical emergency, please immediately call 911  or go to the emergency department.  Pager Numbers  - Dr. Nehemiah Massed: [phone]  - Dr. Laurence Ferrari: [phone]  - Dr. Nicole Kindred: [phone]  In the event of inclement weather, please call our main line at [phone] for an update on the status of any delays or closures.  Dermatology Medication Tips: Please keep the boxes that topical medications come in in order to help keep track of the instructions about where and how to use these. Pharmacies typically print the medication instructions only on the boxes and not directly on the medication tubes.   If your medication is too expensive, please contact our office at [phone] option 4 or send [place] a message through [place].   We are unable to tell what your co-pay for medications will be in advance as this is different depending on your insurance coverage. However, we may be able to find a substitute medication at lower cost or fill out paperwork to get insurance to cover a needed medication.   If a prior authorization is required to get your medication covered by your insurance company, please allow [place] 1-2 business days to complete this process.  Drug prices often vary depending on where the prescription is filled and some pharmacies may offer cheaper prices.  The website www.goodrx.com contains coupons for medications through different pharmacies. The prices here do not account for what the cost may be with help from insurance (it may be cheaper with your insurance),  but the website can give you the price if you did not use any insurance.  - You can print the associated coupon and take it with your prescription to the pharmacy.  - You may also stop by our office during regular business hours and pick up a GoodRx coupon card.  - If you need your prescription sent electronically to a different pharmacy, notify our office through [facility] or by phone at [phone] option 4.     Si Usted Necesita Algo Despus de Su  Visita  Tambin puede enviarnos un mensaje a travs de Pharmacist, community. Por lo general respondemos a los mensajes de MyChart en el transcurso de 1 a 2 das hbiles.  Para renovar recetas, por favor pida a su farmacia que se ponga en contacto con nuestra oficina. Harland Dingwall de fax es [place] [phone].  Si tiene un asunto urgente cuando la clnica est cerrada y que no puede esperar hasta el siguiente da hbil, puede llamar/localizar a su doctor(a) al nmero que aparece a continuacin.   Por favor, tenga en cuenta que aunque hacemos todo lo posible para estar disponibles para asuntos urgentes fuera del horario de [place], no estamos disponibles las 24 horas del da, los 7 das de la [place].   Si tiene un problema urgente y no puede comunicarse con nosotros, puede optar por buscar atencin mdica  en el consultorio de su doctor(a), en una clnica privada, en un centro de atencin urgente o en una sala de emergencias.  Si tiene Engineering geologist, por favor llame inmediatamente al 911 o vaya a la sala de emergencias.  Nmeros de bper  - Dr. Nehemiah Massed: [phone]  - Dra. Moye: [phone]  - Dra. Nicole Kindred: [phone]  En caso de inclemencias del [place], por favor llame a Johnsie Kindred principal al [phone] para una actualizacin sobre el [place] de cualquier retraso o cierre.  Consejos para la medicacin en dermatologa: Por favor, guarde las cajas en las que vienen los medicamentos de uso tpico para ayudarle a seguir las instrucciones sobre dnde y cmo usarlos. Las farmacias generalmente imprimen las instrucciones del medicamento slo en las cajas y no directamente en los tubos del [place].   Si su medicamento es muy caro, por favor, pngase en contacto con Zigmund Daniel llamando al [phone] y presione la opcin 4 o envenos un mensaje a travs de Pharmacist, community.   No podemos decirle cul ser su copago por los medicamentos por adelantado ya que esto es diferente dependiendo de la  cobertura de su seguro. Sin embargo, es posible que podamos encontrar un medicamento sustituto a Electrical engineer un formulario para que el seguro cubra el medicamento que se considera necesario.   Si se requiere una autorizacin previa para que su compaa de seguros [place] su medicamento, por favor permtanos de 1 a 2 das hbiles para completar este proceso.  Los precios de los medicamentos varan con frecuencia dependiendo del Environmental consultant de dnde se surte la receta y alguna farmacias pueden ofrecer precios ms baratos.  El sitio web www.goodrx.com tiene cupones para medicamentos de Airline pilot. Los precios aqu no tienen en cuenta lo que podra costar con la ayuda del seguro (puede ser ms barato con su seguro), pero el sitio web puede darle el precio si no utiliz Research scientist (physical sciences).  - Puede imprimir el cupn correspondiente y llevarlo con su receta a la farmacia.  - Tambin puede pasar por nuestra oficina durante el horario de atencin regular y Charity fundraiser una tarjeta de cupones  de GoodRx.  - Si necesita que su receta se enve electrnicamente a una farmacia diferente, informe a nuestra oficina a travs de MyChart de [place] o por telfono llamando al [phone] y presione la opcin 4.

## 2022-06-04 NOTE — Progress Notes (Signed)
   Follow-Up Visit   Subjective  Jason Mcdonald is a 36 y.o. male who presents for the following: Follow-up (7 months f/u on psoriasis on his scalp and legs, treating with Stelera injection q 12 weeks and Clobetasol solution with a fair response. ).  The following portions of the chart were reviewed this encounter and updated as appropriate:   Tobacco  Allergies  Meds  Problems  Med Hx  Surg Hx  Fam Hx     Review of Systems:  No other skin or systemic complaints except as noted in HPI or Assessment and Plan.  Objective  Well appearing patient in no apparent distress; mood and affect are within normal limits.  A focused examination was performed including scalp,legs . Relevant physical exam findings are noted in the Assessment and Plan.  scalp,legs Mild pink plaques at legs and posterior lateral scalp    Assessment & Plan  Psoriasis scalp,legs Chronic and persistent condition with duration or expected duration over one year. Condition is symptomatic / bothersome to patient. Not to goal. Psoriasis - severe on systemic "biologic" treatment injections.  Psoriasis is a chronic non-curable, but treatable genetic/hereditary disease that may have other systemic features affecting other organ systems such as joints (Psoriatic Arthritis).  It is linked with heart disease, inflammatory bowel disease, non-alcoholic fatty liver disease, and depression. Significant skin psoriasis and/or psoriatic arthritis may have significant symptoms and affects activities of daily activity and often benefits from systemic "biologic" injection treatments.  These "biologic" treatments have some potential side effects including immunosuppression and require pre-treatment laboratory screening and periodic laboratory monitoring and periodic in person evaluation and monitoring by the attending dermatologist physician (long term medication management).  Labs ordered ordered CBC with diff, TB test  Continue Stelera  injection q 12 weeks  Start Zoryve cream apply to affected skin qd Start Clobetasol solution apply to affected skin qd-bid prn, Avoid applying to face, groin, and axilla. Use as directed. Long-term use can cause thinning of the skin.   Topical steroids (such as triamcinolone, fluocinolone, fluocinonide, mometasone, clobetasol, halobetasol, betamethasone, hydrocortisone) can cause thinning and lightening of the skin if they are used for too long in the same area. Your physician has selected the right strength medicine for your problem and area affected on the body. Please use your medication only as directed by your physician to prevent side effects.    Related Procedures QuantiFERON-TB Gold Plus CBC with Differential/Platelets  Related Medications mometasone (ELOCON) 0.1 % lotion Apply topically as directed. Qd up to 5 days a week to aa psoriasis on scalp and legs prn flares  STELARA 90 MG/ML SOSY injection MAINTENANCE: INJECT 1 SYRINGE SUBCUTANEOUSLY EVERY 12 WEEKS. REFRIGERATE. DO NOT FREEZE.  Return in about 6 months (around 12/04/2022) for Psoriasis .  IMarye Round, CMA, am acting as scribe for [name], MD .  Documentation: I have reviewed the above documentation for accuracy and completeness, and I agree with the above.  [name], MD

## 2022-06-09 ENCOUNTER — Telehealth: Payer: Self-pay

## 2022-06-09 DIAGNOSIS — L409 Psoriasis, unspecified: Secondary | ICD-10-CM

## 2022-06-09 LAB — CBC WITH DIFFERENTIAL/PLATELET
Basophils Absolute: 0.1 10*3/uL (ref 0.0–0.2)
Basos: 1 %
EOS (ABSOLUTE): 0.3 10*3/uL (ref 0.0–0.4)
Eos: 6 %
Hematocrit: 45.7 % (ref 37.5–51.0)
Hemoglobin: 15.8 g/dL (ref 13.0–17.7)
Immature Grans (Abs): 0 10*3/uL (ref 0.0–0.1)
Immature Granulocytes: 1 %
Lymphocytes Absolute: 2.1 10*3/uL (ref 0.7–3.1)
Lymphs: 35 %
MCH: 30.1 pg (ref 26.6–33.0)
MCHC: 34.6 g/dL (ref 31.5–35.7)
MCV: 87 fL (ref 79–97)
Monocytes Absolute: 0.5 10*3/uL (ref 0.1–0.9)
Monocytes: 9 %
Neutrophils Absolute: 2.8 10*3/uL (ref 1.4–7.0)
Neutrophils: 48 %
Platelets: 284 10*3/uL (ref 150–450)
RBC: 5.25 x10E6/uL (ref 4.14–5.80)
RDW: 12.1 % (ref 11.6–15.4)
WBC: 5.8 10*3/uL (ref 3.4–10.8)

## 2022-06-09 LAB — QUANTIFERON-TB GOLD PLUS
QuantiFERON Mitogen Value: >10 IU/mL
QuantiFERON Nil Value: 0.09 IU/mL
QuantiFERON TB1 Ag Value: 0.1 IU/mL
QuantiFERON TB2 Ag Value: 0.1 IU/mL
QuantiFERON-TB Gold Plus: NEGATIVE

## 2022-06-09 MED ORDER — STELARA 90 MG/ML ~~LOC~~ SOSY: PREFILLED_SYRINGE | SUBCUTANEOUS | 1 refills | Status: DC

## 2022-06-09 NOTE — Telephone Encounter (Signed)
Patient informed of lab results refills sent to pharmacy.

## 2022-06-09 NOTE — Telephone Encounter (Signed)
-----   Message from Ralene Bathe, MD sent at 06/09/2022 12:25 PM EST ----- Lab from 06/04/2022 showed: TB test/ Quantiferon Gold = Negative / Normal Blood counts = normal  Continue Stelara for Psoriasis Keep follow up appt.

## 2022-06-15 ENCOUNTER — Encounter: Payer: Self-pay | Admitting: Dermatology

## 2022-11-10 ENCOUNTER — Telehealth: Payer: Self-pay

## 2022-11-10 NOTE — Telephone Encounter (Signed)
[facility] is denying patient's Stelara coverage due to chart notes on 12/06 stating "Condition is symptomatic/ bothersome to patient and not at goal".

## 2022-11-11 NOTE — Telephone Encounter (Signed)
Updated patient's information and [facility] aw

## 2022-12-08 ENCOUNTER — Ambulatory Visit (INDEPENDENT_AMBULATORY_CARE_PROVIDER_SITE_OTHER): Payer: BC Managed Care – PPO | Admitting: Internal Medicine

## 2022-12-08 VITALS — BP 143/93 | HR 76 | Resp 14 | Ht 70.0 in | Wt 259.0 lb

## 2022-12-08 DIAGNOSIS — E669 Obesity, unspecified: Secondary | ICD-10-CM

## 2022-12-08 DIAGNOSIS — Z7189 Other specified counseling: Secondary | ICD-10-CM | POA: Diagnosis not present

## 2022-12-08 DIAGNOSIS — I10 Essential (primary) hypertension: Secondary | ICD-10-CM | POA: Diagnosis not present

## 2022-12-08 DIAGNOSIS — G4733 Obstructive sleep apnea (adult) (pediatric): Secondary | ICD-10-CM

## 2022-12-08 NOTE — Patient Instructions (Signed)

## 2022-12-08 NOTE — Progress Notes (Unsigned)
[facility] [address] [place], [place] [address]  Pulmonary Sleep Medicine   Office Visit Note  Patient Name: Jason Mcdonald DOB: 1986-06-03 MRN [id number]    Chief Complaint: Obstructive Sleep Apnea visit  Brief History:  Siraj is seen today for an annual follow up on APAP at 5-20 cmh20. The patient has a 3 year history of sleep apnea. Patient is using PAP nightly.  The patient feels rested after sleeping with PAP.  The patient reports feeling the same from PAP use. Reported sleepiness is  improved and the Epworth Sleepiness Score is 0 out of 24. The patient does not take naps. The patient complains of the following: No complaints.  The compliance download shows 100% compliance with an average use time of 7:54 hours. The AHI is 0.9.  The patient does not complain of limb movements disrupting sleep.  ROS  General: (-) fever, (-) chills, (-) night sweat Nose and Sinuses: (-) nasal stuffiness or itchiness, (-) postnasal drip, (-) nosebleeds, (-) sinus trouble. Mouth and Throat: (-) sore throat, (-) hoarseness. Neck: (-) swollen glands, (-) enlarged thyroid, (-) neck pain. Respiratory: - cough, - shortness of breath, - wheezing. Neurologic: - numbness, - tingling. Psychiatric: - anxiety, - depression   Current Medication: Outpatient Encounter Medications as of 12/08/2022  Medication Sig   amLODipine-valsartan (EXFORGE) 10-320 MG tablet Take 1 tablet by mouth daily.   benzonatate (TESSALON) 100 MG capsule Take 2 capsules (200 mg total) by mouth every 8 (eight) hours.   clobetasol (TEMOVATE) 0.05 % external solution Apply to affected skin qd-bid prn, Avoid applying to face, groin, and axilla. Use as directed. Long-term use can cause thinning of the skin.   diphenoxylate-atropine (LOMOTIL) 2.5-0.025 MG tablet Take 1 tablet by mouth 4 (four) times daily as needed for diarrhea or loose stools.   fenofibrate (TRICOR) 48 MG tablet Take by mouth.   ipratropium (ATROVENT)  0.06 % nasal spray Place 2 sprays into both nostrils 4 (four) times daily.   ketoconazole (NIZORAL) 2 % shampoo 3 times a week lather from neck to thighs, leave on 5-10 minutes, rinse well   mometasone (ELOCON) 0.1 % lotion Apply topically as directed. Qd up to 5 days a week to aa psoriasis on scalp and legs prn flares   promethazine-dextromethorphan (PROMETHAZINE-DM) 6.25-15 MG/5ML syrup Take 5 mLs by mouth 4 (four) times daily as needed.   Roflumilast (ZORYVE) 0.3 % CREA Apply to affected skin once a day   ustekinumab (STELARA) 90 MG/ML SOSY injection MAINTENANCE: INJECT 1 SYRINGE SUBCUTANEOUSLY EVERY 12 WEEKS. REFRIGERATE. DO NOT FREEZE.   No facility-administered encounter medications on file as of 12/08/2022.    Surgical History: Past Surgical History:  Procedure Laterality Date   ORIF CLAVICLE FRACTURE      Medical History: Past Medical History:  Diagnosis Date   Hypercholesterolemia    Hypertension    Psoriasis     Family History: Non contributory to the present illness  Social History: Social History   Socioeconomic History   Marital status: Single    Spouse name: Not on file   Number of children: Not on file   Years of education: Not on file   Highest education level: Not on file  Occupational History   Not on file  Tobacco Use   Smoking status: Never   Smokeless tobacco: Never  Vaping Use   Vaping Use: Never used  Substance and Sexual Activity   Alcohol use: Not Currently   Drug use: Never  Sexual activity: Not on file  Other Topics Concern   Not on file  Social History Narrative   Not on file   Social Determinants of Health   Financial Resource Strain: Not on file  Food Insecurity: Not on file  Transportation Needs: Not on file  Physical Activity: Not on file  Stress: Not on file  Social Connections: Not on file  Intimate Partner Violence: Not on file    Vital Signs: Blood pressure (!) 143/93, pulse 76, resp. rate 14, height 5\' 10"  (1.778 m),  weight 259 lb (117.5 kg), SpO2 97 %. Body mass index is 37.16 kg/m.    Examination: General Appearance: The patient is well-developed, well-nourished, and in no distress. Neck Circumference: 48 cm Skin: Gross inspection of skin unremarkable. Head: normocephalic, no gross deformities. Eyes: no gross deformities noted. ENT: ears appear grossly normal Neurologic: Alert and oriented. No involuntary movements.  STOP BANG RISK ASSESSMENT S (snore) Have you been told that you snore?     NO   T (tired) Are you often tired, fatigued, or sleepy during the day?   NO  O (obstruction) Do you stop breathing, choke, or gasp during sleep? NO   P (pressure) Do you have or are you being treated for high blood pressure? YES   B (BMI) Is your body index greater than 35 kg/m? YES   A (age) Are you 36 years old or older? NO   N (neck) Do you have a neck circumference greater than 16 inches?   YES   G (gender) Are you a male? YES   TOTAL STOP/BANG "YES" ANSWERS 4       A STOP-Bang score of 2 or less is considered low risk, and a score of 5 or more is high risk for having either moderate or severe OSA. For people who score 3 or 4, doctors may need to perform further assessment to determine how likely they are to have OSA.         EPWORTH SLEEPINESS SCALE:  Scale:  (0)= no chance of dozing; (1)= slight chance of dozing; (2)= moderate chance of dozing; (3)= high chance of dozing  Chance  Situtation    Sitting and reading: 0    Watching TV: 0    Sitting Inactive in public: 0    As a passenger in car: 0      Lying down to rest: 0    Sitting and talking: 0    Sitting quielty after lunch: 0    In a car, stopped in traffic: 0   TOTAL SCORE:   0 out of 24    SLEEP STUDIES:  HST (06/08/19) RDI 27.7, min SPO2 81%   CPAP COMPLIANCE DATA:  Date Range: 12/04/21 - 12/03/22  Average Daily Use: 7:54 hours  Median Use: 7:56 hours  Compliance for > 4 Hours: 365 days  AHI: 0.9  respiratory events per hour  Days Used: 365/365  Mask Leak: 40.7  95th Percentile Pressure: 10.3 cmh20         LABS: No results found for this or any previous visit (from the past 2160 hour(s)).  Radiology: No results found.  No results found.  No results found.    Assessment and Plan: Patient Active Problem List   Diagnosis Date Noted   OSA on CPAP 12/17/2020   CPAP use counseling 12/17/2020   OSA (obstructive sleep apnea) 08/27/2020   Essential hypertension 08/27/2020   Obesity (BMI 35.0-39.9 without comorbidity) 08/27/2020   Psoriasis 04/01/2019  Hypertension, uncontrolled 12/15/2014   1. OSA on CPAP The patient does tolerate PAP and reports  benefit from PAP use. The patient was reminded how to clean equipment  and advised to replace supplies routinely. The patient was also counselled on weight loss. The compliance is excellent. The AHI is 0.9.   OSA on cpap- controlled. Continue with excellent compliance with pap. CPAP continues to be medically necessary to treat this patient's OSA. F/u one year.    2. CPAP use counseling CPAP Counseling: had a lengthy discussion with the patient regarding the importance of PAP therapy in management of the sleep apnea. Patient appears to understand the risk factor reduction and also understands the risks associated with untreated sleep apnea. Patient will try to make a good faith effort to remain compliant with therapy. Also instructed the patient on proper cleaning of the device including the water must be changed daily if possible and use of distilled water is preferred. Patient understands that the machine should be regularly cleaned with appropriate recommended cleaning solutions that do not damage the PAP machine for example given white vinegar and water rinses. Other methods such as ozone treatment may not be as good as these simple methods to achieve cleaning.   3. Obesity (BMI 35.0-39.9 without comorbidity) Obesity  Counseling: Had a lengthy discussion regarding patients BMI and weight issues. Patient was instructed on portion control as well as increased activity. Also discussed caloric restrictions with trying to maintain intake less than 2000 Kcal. Discussions were made in accordance with the 5As of weight management. Simple actions such as not eating late and if able to, taking a walk is suggested.   4. Essential hypertension Hypertension Counseling:   The following hypertensive lifestyle modification were recommended and discussed:  1. Limiting alcohol intake to less than 1 oz/day of ethanol:(24 oz of beer or 8 oz of wine or 2 oz of 100-proof whiskey). 2. Take baby ASA 81 mg daily. 3. Importance of regular aerobic exercise and losing weight. 4. Reduce dietary saturated fat and cholesterol intake for overall cardiovascular health. 5. Maintaining adequate dietary potassium, calcium, and magnesium intake. 6. Regular monitoring of the blood pressure. 7. Reduce sodium intake to less than 100 mmol/day (less than 2.3 gm of sodium or less than 6 gm of sodium choride)       General Counseling: I have discussed the findings of the evaluation and examination with Hans.  I have also discussed any further diagnostic evaluation thatmay be needed or ordered today. Kavon verbalizes understanding of the findings of todays visit. We also reviewed his medications today and discussed drug interactions and side effects including but not limited excessive drowsiness and altered mental states. We also discussed that there is always a risk not just to him but also people around him. he has been encouraged to call the office with any questions or concerns that should arise related to todays visit.  No orders of the defined types were placed in this encounter.       I have personally obtained a history, examined the patient, evaluated laboratory and imaging results, formulated the assessment and plan and placed orders. This  patient was seen today by Emmaline Kluver, PA-C in collaboration with Dr. Freda Munro.   Yevonne Pax, MD [facility] Diplomate ABMS Pulmonary Critical Care Medicine and Sleep Medicine

## 2022-12-17 ENCOUNTER — Ambulatory Visit: Payer: BC Managed Care – PPO | Admitting: Dermatology

## 2023-01-20 ENCOUNTER — Other Ambulatory Visit: Payer: Self-pay | Admitting: Dermatology

## 2023-01-20 DIAGNOSIS — L409 Psoriasis, unspecified: Secondary | ICD-10-CM

## 2023-02-03 ENCOUNTER — Ambulatory Visit
Admission: EM | Admit: 2023-02-03 | Discharge: 2023-02-03 | Disposition: A | Discharge status: Home / Self Care | Payer: BC Managed Care – PPO | Attending: Emergency Medicine | Admitting: Emergency Medicine

## 2023-02-03 ENCOUNTER — Ambulatory Visit (INDEPENDENT_AMBULATORY_CARE_PROVIDER_SITE_OTHER): Payer: BC Managed Care – PPO

## 2023-02-03 DIAGNOSIS — K5732 Diverticulitis of large intestine without perforation or abscess without bleeding: Secondary | ICD-10-CM | POA: Insufficient documentation

## 2023-02-03 LAB — URINALYSIS, W/ REFLEX TO CULTURE (INFECTION SUSPECTED)
Bilirubin Urine: NEGATIVE
Glucose, UA: NEGATIVE mg/dL
Ketones, ur: NEGATIVE mg/dL
Leukocytes,Ua: NEGATIVE
Nitrite: NEGATIVE
Protein, ur: 30 mg/dL — AB
Specific Gravity, Urine: 1.025 (ref 1.005–1.030)
Squamous Epithelial / HPF: NONE SEEN /HPF (ref 0–5)
pH: 8.5 — ABNORMAL HIGH (ref 5.0–8.0)

## 2023-02-03 MED ORDER — AMOXICILLIN-POT CLAVULANATE 875-125 MG PO TABS: 1.0000 | ORAL_TABLET | Freq: Two times a day (BID) | ORAL | 0 refills | Status: AC

## 2023-02-03 NOTE — ED Provider Notes (Signed)
[facility]    CSN: [id number] Arrival date & time: 02/03/23  0907      History   Chief Complaint Chief Complaint  Patient presents with   Abdominal Pain    HPI Jason Mcdonald is a 37 y.o. male.   HPI  37 year old male with a past medical history significant for hypertension, hypercholesterolemia, psoriasis, and obstructive sleep apnea presents for evaluation of 70s with lower abdominal pain with no associated nausea or vomiting.  He has been able to eat and drink without difficulty.  Pain is off and on.  No associated fever, urinary complaints, change in urinary stream, or testicular pain.  Also no blood in the urine/stool or low back pain.  Past Medical History:  Diagnosis Date   Hypercholesterolemia    Hypertension    Psoriasis     Patient Active Problem List   Diagnosis Date Noted   OSA on CPAP 12/17/2020   CPAP use counseling 12/17/2020   OSA (obstructive sleep apnea) 08/27/2020   Essential hypertension 08/27/2020   Obesity (BMI 35.0-39.9 without comorbidity) 08/27/2020   Psoriasis 04/01/2019   Hypertension, uncontrolled 12/15/2014    Past Surgical History:  Procedure Laterality Date   ORIF CLAVICLE FRACTURE         Home Medications    Prior to Admission medications   Medication Sig Start Date End Date Taking? Authorizing Provider  amLODipine-valsartan (EXFORGE) 10-320 MG tablet Take 1 tablet by mouth daily. 02/14/21  Yes [provider]  amoxicillin-clavulanate (AUGMENTIN) 875-125 MG tablet Take 1 tablet by mouth every 12 (twelve) hours for 10 days. 02/03/23 02/13/23 Yes Becky Augusta, NP  benzonatate (TESSALON) 100 MG capsule Take 2 capsules (200 mg total) by mouth every 8 (eight) hours. 05/04/21  Yes Becky Augusta, NP  clobetasol (TEMOVATE) 0.05 % external solution Apply to affected skin qd-bid prn, Avoid applying to face, groin, and axilla. Use as directed. Long-term use can cause thinning of the skin. 06/04/22  Yes Deirdre Evener, MD   diphenoxylate-atropine (LOMOTIL) 2.5-0.025 MG tablet Take 1 tablet by mouth 4 (four) times daily as needed for diarrhea or loose stools. 02/02/19  Yes Cook, Jayce G, DO  fenofibrate (TRICOR) 48 MG tablet Take by mouth. 12/21/20 02/03/23 Yes [provider]  ipratropium (ATROVENT) 0.06 % nasal spray Place 2 sprays into both nostrils 4 (four) times daily. 05/04/21  Yes Becky Augusta, NP  ketoconazole (NIZORAL) 2 % shampoo 3 times a week lather from neck to thighs, leave on 5-10 minutes, rinse well 10/31/20  Yes Deirdre Evener, MD  mometasone (ELOCON) 0.1 % lotion Apply topically as directed. Qd up to 5 days a week to aa psoriasis on scalp and legs prn flares 05/01/21  Yes Deirdre Evener, MD  promethazine-dextromethorphan (PROMETHAZINE-DM) 6.25-15 MG/5ML syrup Take 5 mLs by mouth 4 (four) times daily as needed. 05/04/21  Yes Becky Augusta, NP  Roflumilast (ZORYVE) 0.3 % CREA Apply to affected skin once a day 06/04/22  Yes Deirdre Evener, MD  ustekinumab [facility]) 90 MG/ML SOSY injection INJECT 1 SYRINGE UNDER THE SKIN EVERY 12 WEEKS 01/20/23  Yes Deirdre Evener, MD    Family History Family History  Problem Relation Age of Onset   Diabetes Mother     Social History Social History   Tobacco Use   Smoking status: Never   Smokeless tobacco: Never  Vaping Use   Vaping status: Never Used  Substance Use Topics   Alcohol use: Not Currently   Drug use: Never  Allergies   Patient has no known allergies.   Review of Systems Review of Systems  Constitutional:  Negative for fever.  Gastrointestinal:  Positive for abdominal pain and diarrhea. Negative for nausea and vomiting.  Genitourinary:  Negative for decreased urine volume, dysuria, frequency, hematuria, testicular pain and urgency.     Physical Exam Triage Vital Signs ED Triage Vitals  Encounter Vitals Group     BP      Systolic BP Percentile      Diastolic BP Percentile      Pulse      Resp      Temp      Temp  src      SpO2      Weight      Height      Head Circumference      Peak Flow      Pain Score      Pain Loc      Pain Education      Exclude from Growth Chart    No data found.  Updated Vital Signs BP (!) 160/102 (BP Location: Left Arm)   Pulse 85   Temp 99 F (37.2 C) (Oral)   Resp 16   Ht 5\' 9"  (1.753 m)   Wt 260 lb (117.9 kg)   SpO2 94%   BMI 38.40 kg/m   Visual Acuity Right Eye Distance:   Left Eye Distance:   Bilateral Distance:    Right Eye Near:   Left Eye Near:    Bilateral Near:     Physical Exam Vitals and nursing note reviewed.  Constitutional:      Appearance: Normal appearance. He is not ill-appearing.  HENT:     Head: Normocephalic and atraumatic.  Cardiovascular:     Rate and Rhythm: Normal rate and regular rhythm.     Pulses: Normal pulses.     Heart sounds: Normal heart sounds. No murmur heard.    No friction rub. No gallop.  Pulmonary:     Effort: Pulmonary effort is normal.     Breath sounds: Normal breath sounds. No wheezing, rhonchi or rales.  Abdominal:     General: Abdomen is flat.     Palpations: Abdomen is soft.     Tenderness: There is abdominal tenderness. There is no right CVA tenderness, left CVA tenderness, guarding or rebound.  Skin:    General: Skin is warm and dry.     Capillary Refill: Capillary refill takes less than 2 seconds.     Findings: No rash.  Neurological:     Mental Status: He is alert.      UC Treatments / Results  Labs (all labs ordered are listed, but only abnormal results are displayed) Labs Reviewed  URINALYSIS, W/ REFLEX TO CULTURE (INFECTION SUSPECTED) - Abnormal; Notable for the following components:      Result Value   pH 8.5 (*)    Hgb urine dipstick SMALL (*)    Protein, ur 30 (*)    Bacteria, UA FEW (*)    All other components within normal limits    EKG   Radiology DG Abdomen 1 View  Result Date: 02/03/2023 CLINICAL DATA:  Intermittent pain left lower quadrant of abdomen, diarrhea  EXAM: ABDOMEN - 1 VIEW COMPARISON:  None Available. FINDINGS: Bowel gas pattern is nonspecific. Small to moderate amount of stool is seen in proximal colon. There is no fecal impaction in rectum. No abnormal masses or calcifications are seen. Visualized bony structures are unremarkable.  IMPRESSION: Nonspecific bowel gas pattern. Electronically Signed   By: Ernie Avena M.D.   On: 02/03/2023 10:18    Procedures Procedures (including critical care time)  Medications Ordered in UC Medications - No data to display  Initial Impression / Assessment and Plan / UC Course  I have reviewed the triage vital signs and the nursing notes.  Pertinent labs & imaging results that were available during my care of the patient were reviewed by me and considered in my medical decision making (see chart for details).   Patient is a nontoxic-appearing 37 year old male presenting for evaluation of lower abdominal pain as outlined HPI above.  On exam patient has a protuberant abdomen but it is soft.  He has mild left lower quadrant abdominal tenderness but no guarding or rebound.  Remainder the abdomen is benign.  He reports that his last normal bowel movement was "maybe a week ago".  He been having small amounts of liquid stool over the past week which does provide some relief of his symptoms.  He denies any pain with urination or urinary urgency or frequency.  He also has not had any changes in his stream.  The patient reported that he feels his pain suprapubically though he has no tenderness with palpation in that area.  I will however, check a urinalysis to rule out the presence of UTI.  I will also obtain a KUB to look for the presence of constipation, which I suspect that the patient's issue, that may be causing overflow diarrhea.  Urinalysis shows small hemoglobin with 30 protein but negative for leukocyte Estrace or nitrites.  Reflex microscopy shows 6-10 RBCs with few bacteria.  Hyaline casts are  present.  KUB of the abdomen independently reviewed and evaluated by me.  Impression: There is a paucity of bowel gas present.  There is a large stool accumulation in the ascending colon as well as part of the transverse.  There is also stool ball in the rectum.  Radiology overread is pending. Radiology impression states small to moderate amount of stool was seen in the proximal colon but no fecal impaction in the rectum.  No abnormal masses or calcifications are seen.  Nonspecific bowel gas pattern.  Patient does have a mildly elevated temp of 99 in clinic and given the blood in the patient's urine and protein there is concern that this may be secondary to prostatitis.  The patient could also be developing diverticulitis given his left lower quadrant abdominal tenderness.  DRE was performed and patient has a smooth prostate that is normal in position and nontender to palpation.  I will treat the patient presumptively for diverticulitis with Augmentin 875 twice daily for 10 days.  I have advised him that if he has any increase in abdominal pain, develops a higher fever, or develops nausea and vomiting that he cannot keep down medications that he should be seen in the ER.  Work note provided.   Final Clinical Impressions(s) / UC Diagnoses   Final diagnoses:  Diverticulitis of colon without hemorrhage     Discharge Instructions      Take the Augmentin 875 mg twice daily with food for 10 days for treatment of your presumptive diverticulitis.  You do not have any dietary restrictions at this time.  If you develop any increasing abdominal pain, blood in your stool, or nausea and vomiting and cannot keep down medications or fluids you need to go to the ER for evaluation.     ED Prescriptions  Medication Sig Dispense Auth. Provider   amoxicillin-clavulanate (AUGMENTIN) 875-125 MG tablet Take 1 tablet by mouth every 12 (twelve) hours for 10 days. 20 tablet Becky Augusta, NP      PDMP not  reviewed this encounter.   Becky Augusta, NP 02/03/23 1030

## 2023-02-03 NOTE — Discharge Instructions (Signed)
Take the Augmentin 875 mg twice daily with food for 10 days for treatment of your presumptive diverticulitis.  You do not have any dietary restrictions at this time.  If you develop any increasing abdominal pain, blood in your stool, or nausea and vomiting and cannot keep down medications or fluids you need to go to the ER for evaluation.

## 2023-02-03 NOTE — ED Triage Notes (Signed)
Pt c/o lower abd pain x7 days. Denies any N/V/D. Is able to eat & keep fluids down. States pain is sporadic. ZOX:WRUE AM

## 2023-02-16 ENCOUNTER — Ambulatory Visit: Payer: BC Managed Care – PPO | Admitting: Dermatology

## 2023-02-16 ENCOUNTER — Encounter: Payer: Self-pay | Admitting: Dermatology

## 2023-02-16 DIAGNOSIS — Z1283 Encounter for screening for malignant neoplasm of skin: Secondary | ICD-10-CM | POA: Diagnosis not present

## 2023-02-16 DIAGNOSIS — L409 Psoriasis, unspecified: Secondary | ICD-10-CM | POA: Diagnosis not present

## 2023-02-16 DIAGNOSIS — L578 Other skin changes due to chronic exposure to nonionizing radiation: Secondary | ICD-10-CM

## 2023-02-16 DIAGNOSIS — Z79899 Other long term (current) drug therapy: Secondary | ICD-10-CM

## 2023-02-16 DIAGNOSIS — D1801 Hemangioma of skin and subcutaneous tissue: Secondary | ICD-10-CM

## 2023-02-16 DIAGNOSIS — W908XXA Exposure to other nonionizing radiation, initial encounter: Secondary | ICD-10-CM

## 2023-02-16 DIAGNOSIS — Z7189 Other specified counseling: Secondary | ICD-10-CM

## 2023-02-16 DIAGNOSIS — L814 Other melanin hyperpigmentation: Secondary | ICD-10-CM | POA: Diagnosis not present

## 2023-02-16 DIAGNOSIS — L918 Other hypertrophic disorders of the skin: Secondary | ICD-10-CM

## 2023-02-16 DIAGNOSIS — D229 Melanocytic nevi, unspecified: Secondary | ICD-10-CM

## 2023-02-16 DIAGNOSIS — L821 Other seborrheic keratosis: Secondary | ICD-10-CM

## 2023-02-16 MED ORDER — STELARA 90 MG/ML ~~LOC~~ SOSY: 90.0000 mg | PREFILLED_SYRINGE | SUBCUTANEOUS | 1 refills | Status: DC

## 2023-02-16 MED ORDER — ZORYVE 0.3 % EX CREA: TOPICAL_CREAM | CUTANEOUS | 6 refills | Status: AC

## 2023-02-16 NOTE — Progress Notes (Signed)
Follow-Up Visit   Subjective  Jason Mcdonald is a 37 y.o. male who presents for the following: Psoriasis follow up. On Stelara as directed. Using Mometasone, Clobetasol solution and Zoryve as directed. States psoriasis is well controlled at this point.   The patient presents for Total-Body Skin Exam (TBSE) for skin cancer screening and mole check.  The patient has spots, moles and lesions to be evaluated, some may be new or changing and the patient has concerns that these could be cancer.   Check spot under right arm. Thinks has a skin tag.    The following portions of the chart were reviewed this encounter and updated as appropriate: medications, allergies, medical history  Review of Systems:  No other skin or systemic complaints except as noted in HPI or Assessment and Plan.  Objective  Well appearing patient in no apparent distress; mood and affect are within normal limits.  Areas Examined: Full-body skin exam performed  Relevant exam findings are noted in the Assessment and Plan.      Assessment & Plan   PSORIASIS Mild involvement at right knee and scalp.  4% BSA.  Chronic condition with duration or expected duration over one year. Currently well-controlled.   Counseling and coordination of care for severe psoriasis on systemic treatment  Psoriasis - severe on systemic treatment.  Psoriasis is a chronic non-curable, but treatable genetic/hereditary disease that may have other systemic features affecting other organ systems such as joints (Psoriatic Arthritis).  It is linked with heart disease, inflammatory bowel disease, non-alcoholic fatty liver disease, and depression. Significant skin psoriasis and/or psoriatic arthritis may have significant symptoms and affects activities of daily activity and often benefits from systemic treatments.  These systemic treatments have some potential side effects including immunosuppression and require pre-treatment laboratory screening  and periodic laboratory monitoring and periodic in person evaluation and monitoring by the attending dermatologist physician (long term medication management).   Patient denies joint pain  Treatment Plan: Continue Stelera injection every 12 weeks  Start Zoryve cream apply to affected skin once daily Continue Clobetasol solution apply to affected skin once or twice daily as needed, Avoid applying to face, groin, and axilla. Use as directed. Long-term use can cause thinning of the skin.  Continue Mometasone lotion as directed, as needed.   Topical steroids (such as triamcinolone, fluocinolone, fluocinonide, mometasone, clobetasol, halobetasol, betamethasone, hydrocortisone) can cause thinning and lightening of the skin if they are used for too long in the same area. Your physician has selected the right strength medicine for your problem and area affected on the body. Please use your medication only as directed by your physician to prevent side effects.    Reviewed risks of biologics including immunosuppression, infections, injection site reaction, and failure to improve condition. Goal is control of skin condition, not cure.  Some older biologics such as Humira and Enbrel may slightly increase risk of malignancy and may worsen congestive heart failure.  Taltz and Cosentyx may cause inflammatory bowel disease to flare. The use of biologics requires long term medication management, including periodic office visits and monitoring of blood work.   Long term medication management.  Patient is using long term (months to years) prescription medication  to control their dermatologic condition.  These medications require periodic monitoring to evaluate for efficacy and side effects and may require periodic laboratory monitoring.   Psoriasis  Related Medications mometasone (ELOCON) 0.1 % lotion Apply topically as directed. Qd up to 5 days a week to aa psoriasis  on scalp and legs prn flares  ustekinumab  (STELARA) 90 MG/ML SOSY injection INJECT 1 SYRINGE UNDER THE SKIN EVERY 12 WEEKS  LENTIGINES, SEBORRHEIC KERATOSES, HEMANGIOMAS - Benign normal skin lesions - Benign-appearing - Call for any changes  MELANOCYTIC NEVI - Tan-brown and/or pink-flesh-colored symmetric macules and papules - Benign appearing on exam today - Observation - Call clinic for new or changing moles - Recommend daily use of broad spectrum spf 30+ sunscreen to sun-exposed areas.   ACTINIC DAMAGE - Chronic condition, secondary to cumulative UV/sun exposure - diffuse scaly erythematous macules with underlying dyspigmentation - Recommend daily broad spectrum sunscreen SPF 30+ to sun-exposed areas, reapply every 2 hours as needed.  - Staying in the shade or wearing long sleeves, sun glasses (UVA+UVB protection) and wide brim hats (4-inch brim around the entire circumference of the hat) are also recommended for sun protection.  - Call for new or changing lesions.  SKIN CANCER SCREENING PERFORMED TODAY   Acrochordons (Skin Tags) - Fleshy, skin-colored pedunculated papules - Benign appearing.  - Observe. - If desired, they can be removed with an in office procedure that is not covered by insurance. - Please call the clinic if you notice any new or changing lesions.   Return in about 6 months (around 08/19/2023) for Psoriasis Follow Up.  I, Lawson Radar, CMA, am acting as scribe for Armida Sans, MD.   Documentation: I have reviewed the above documentation for accuracy and completeness, and I agree with the above.  Armida Sans, MD

## 2023-02-16 NOTE — Patient Instructions (Addendum)
Continue Stelera injection q 12 weeks  Continue  Zoryve cream apply to affected skin qd Continue Clobetasol solution apply to affected skin qd-bid prn, Avoid applying to face, groin, and axilla. Use as directed. Long-term use can cause thinning of the skin.  Continue Mometasone lotion as directed, as needed.   Your prescription was sent to [facility] in [place]. A representative from [employer] will contact you within 2 business hours to verify your address and insurance information to schedule a free delivery. If for any reason you do not receive a phone call from them, please reach out to them. Their phone number is [phone] and their hours are Monday-Friday 9:00 am-5:00 pm.      Topical steroids (such as triamcinolone, fluocinolone, fluocinonide, mometasone, clobetasol, halobetasol, betamethasone, hydrocortisone) can cause thinning and lightening of the skin if they are used for too long in the same area. Your physician has selected the right strength medicine for your problem and area affected on the body. Please use your medication only as directed by your physician to prevent side effects.    Reviewed risks of biologics including immunosuppression, infections, injection site reaction, and failure to improve condition. Goal is control of skin condition, not cure.  Some older biologics such as Humira and Enbrel may slightly increase risk of malignancy and may worsen congestive heart failure.  Taltz and Cosentyx may cause inflammatory bowel disease to flare. The use of biologics requires long term medication management, including periodic office visits and monitoring of blood work.    Due to recent changes in healthcare laws, you may see results of your pathology and/or laboratory studies on MyChart before the doctors have had a chance to review them. We understand that in some cases there may be results that are confusing or concerning to you. Please understand that not all results  are received at the same time and often the doctors may need to interpret multiple results in order to provide you with the best plan of care or course of treatment. Therefore, we ask that you please give [place] 2 business days to thoroughly review all your results before contacting the office for clarification. Should we see a critical lab result, you will be contacted sooner.   If You Need Anything After Your Visit  If you have any questions or concerns for your doctor, please call our main line at [phone] and press option 4 to reach your doctor's medical assistant. If no one answers, please leave a voicemail as directed and we will return your call as soon as possible. Messages left after 4 pm will be answered the following business day.   You may also send [place] a message via MyChart. We typically respond to MyChart messages within 1-2 business days.  For prescription refills, please ask your pharmacy to contact our office. Our fax number is [phone].  If you have an urgent issue when the clinic is closed that cannot wait until the next business day, you can page your doctor at the number below.    Please note that while we do our best to be available for urgent issues outside of office hours, we are not available 24/7.   If you have an urgent issue and are unable to reach [place], you may choose to seek medical care at your doctor's office, retail clinic, urgent care center, or emergency room.  If you have a medical emergency, please immediately call 911 or go to the emergency department.  Pager Numbers  - Dr.  Gwen Pounds: [phone]  - Dr. Roseanne Reno: [phone]  - Dr. Katrinka Blazing: [phone]   In the event of inclement weather, please call our main line at [phone] for an update on the status of any delays or closures.  Dermatology Medication Tips: Please keep the boxes that topical medications come in in order to help keep track of the instructions about where and how to use these.  Pharmacies typically print the medication instructions only on the boxes and not directly on the medication tubes.   If your medication is too expensive, please contact our office at [phone] option 4 or send [place] a message through MyChart.   We are unable to tell what your co-pay for medications will be in advance as this is different depending on your insurance coverage. However, we may be able to find a substitute medication at lower cost or fill out paperwork to get insurance to cover a needed medication.   If a prior authorization is required to get your medication covered by your insurance company, please allow [place] 1-2 business days to complete this process.  Drug prices often vary depending on where the prescription is filled and some pharmacies may offer cheaper prices.  The website www.goodrx.com contains coupons for medications through different pharmacies. The prices here do not account for what the cost may be with help from insurance (it may be cheaper with your insurance), but the website can give you the price if you did not use any insurance.  - You can print the associated coupon and take it with your prescription to the pharmacy.  - You may also stop by our office during regular business hours and pick up a GoodRx coupon card.  - If you need your prescription sent electronically to a different pharmacy, notify our office through [facility] or by phone at [phone] option 4.     Si Usted Necesita Algo Despus de Su Visita  Tambin puede enviarnos un mensaje a travs de Clinical cytogeneticist. Por lo general respondemos a los mensajes de MyChart en el transcurso de 1 a 2 das hbiles.  Para renovar recetas, por favor pida a su farmacia que se ponga en contacto con nuestra oficina. Annie Sable de fax es [place] [phone].  Si tiene un asunto urgente cuando la clnica est cerrada y que no puede esperar hasta el siguiente da hbil, puede llamar/localizar a su doctor(a) al nmero  que aparece a continuacin.   Por favor, tenga en cuenta que aunque hacemos todo lo posible para estar disponibles para asuntos urgentes fuera del horario de [place], no estamos disponibles las 24 horas del da, los 7 [address]  [address] de la [place].   Si tiene un problema urgente y no puede comunicarse con nosotros, puede optar por buscar atencin mdica  en el consultorio de su doctor(a), en una clnica privada, en un centro de atencin urgente o en una sala de emergencias.  Si tiene Engineer, drilling, por favor llame inmediatamente al 911 o vaya a la sala de emergencias.  Nmeros de bper  - Dr. Gwen Pounds: [phone]  - Dra. Roseanne Reno: [phone]  - Dr. Katrinka Blazing: [phone]   En caso de inclemencias del tiempo, por favor llame a Lacy Duverney principal al [phone] para una actualizacin sobre el [place] de cualquier retraso o cierre.  Consejos para la medicacin en dermatologa: Por favor, guarde las cajas en las que vienen los medicamentos de uso tpico para ayudarle a seguir las instrucciones sobre dnde y cmo usarlos. Las farmacias generalmente imprimen las instrucciones del  medicamento slo en las cajas y no directamente en los tubos del medicamento.   Si su medicamento es muy caro, por favor, pngase en contacto con Rolm Gala llamando al [phone] y presione la opcin 4 o envenos un mensaje a travs de Clinical cytogeneticist.   No podemos decirle cul ser su copago por los medicamentos por adelantado ya que esto es diferente dependiendo de la cobertura de su seguro. Sin embargo, es posible que podamos encontrar un medicamento sustituto a Audiological scientist un formulario para que el seguro cubra el medicamento que se considera necesario.   Si se requiere una autorizacin previa para que su compaa de seguros [place] su medicamento, por favor permtanos de 1 a 2 das hbiles para completar [address]  Los precios de los medicamentos varan con frecuencia dependiendo del Environmental consultant de dnde se  surte la receta y alguna farmacias pueden ofrecer precios ms baratos.  El sitio web www.goodrx.com tiene cupones para medicamentos de Health and safety inspector. Los precios aqu no tienen en cuenta lo que podra costar con la ayuda del seguro (puede ser ms barato con su seguro), pero el sitio web puede darle el precio si no utiliz Tourist information centre manager.  - Puede imprimir el cupn correspondiente y llevarlo con su receta a la farmacia.  - Tambin puede pasar por nuestra oficina durante el horario de atencin regular y Education officer, museum una tarjeta de cupones de GoodRx.  - Si necesita que su receta se enve electrnicamente a una farmacia diferente, informe a nuestra oficina a travs de MyChart de [place] o por telfono llamando al [phone] y presione la opcin 4.

## 2023-02-23 ENCOUNTER — Encounter: Payer: Self-pay | Admitting: Dermatology

## 2023-03-03 ENCOUNTER — Ambulatory Visit: Payer: BC Managed Care – PPO | Admitting: Dermatology

## 2023-08-19 ENCOUNTER — Ambulatory Visit: Payer: BC Managed Care – PPO | Admitting: Dermatology

## 2023-09-21 ENCOUNTER — Other Ambulatory Visit: Payer: Self-pay | Admitting: Dermatology

## 2023-09-21 ENCOUNTER — Encounter: Payer: Self-pay | Admitting: Dermatology

## 2023-09-21 ENCOUNTER — Ambulatory Visit (INDEPENDENT_AMBULATORY_CARE_PROVIDER_SITE_OTHER): Payer: BC Managed Care – PPO | Admitting: Dermatology

## 2023-09-21 DIAGNOSIS — Z7189 Other specified counseling: Secondary | ICD-10-CM | POA: Diagnosis not present

## 2023-09-21 DIAGNOSIS — Z79899 Other long term (current) drug therapy: Secondary | ICD-10-CM | POA: Diagnosis not present

## 2023-09-21 DIAGNOSIS — L409 Psoriasis, unspecified: Secondary | ICD-10-CM | POA: Diagnosis not present

## 2023-09-21 DIAGNOSIS — L821 Other seborrheic keratosis: Secondary | ICD-10-CM | POA: Diagnosis not present

## 2023-09-21 MED ORDER — SKYRIZI PEN 150 MG/ML ~~LOC~~ SOAJ
SUBCUTANEOUS | 3 refills | Status: DC
Start: 2023-09-21 — End: 2023-09-21

## 2023-09-21 MED ORDER — SKYRIZI PEN 150 MG/ML ~~LOC~~ SOAJ
SUBCUTANEOUS | 3 refills | Status: DC
Start: 2023-09-21 — End: 2024-03-09

## 2023-09-21 NOTE — Progress Notes (Signed)
   Follow-Up Visit   Subjective  Jason Mcdonald is a 38 y.o. male who presents for the following: Psoriasis at arms and scalp. Patient is clear today.   Patient has been on Stelara for about 10 years, his last injection was in November. The co-pay for his Marcy Panning has increased from $50 to $1100 and that is with assistance. Uses Zoryve topically.   The following portions of the chart were reviewed this encounter and updated as appropriate: medications, allergies, medical history  Review of Systems:  No other skin or systemic complaints except as noted in HPI or Assessment and Plan.  Objective  Well appearing patient in no apparent distress; mood and affect are within normal limits.  Areas Examined: Arms, legs, scalp  Relevant exam findings are noted in the Assessment and Plan.      Assessment & Plan   PSORIASIS   Related Medications mometasone (ELOCON) 0.1 % lotion Apply topically as directed. Qd up to 5 days a week to aa psoriasis on scalp and legs prn flares risankizumab-rzaa (SKYRIZI PEN) 150 MG/ML pen Every 12 weeks for maintenance. LONG-TERM USE OF HIGH-RISK MEDICATION   Related Procedures QuantiFERON-TB Gold Plus Related Medications risankizumab-rzaa (SKYRIZI PEN) 150 MG/ML pen Every 12 weeks for maintenance. COUNSELING AND COORDINATION OF CARE   SEBORRHEIC KERATOSES    PSORIASIS, previously well-controlled on Stelara but copay is now too high with new insurance and patient needs alternative. Will likely flare off treatment Clear on elbows knees and scalp 0% BSA.  Chronic condition with duration or expected duration over one year. Currently well-controlled.   Treatment Plan: Stop stelara and switch to Skyrizi 150 mg to start q 12 weeks. If Cristy Folks is expensive, will try Cosentyx Continue mometasone BID prn  Counseling on psoriasis and coordination of care  psoriasis is a chronic non-curable, but treatable genetic/hereditary disease that may have  other systemic features affecting other organ systems such as joints (Psoriatic Arthritis). It is associated with an increased risk of inflammatory bowel disease, heart disease, non-alcoholic fatty liver disease, and depression.  Treatments include light and laser treatments; topical medications; and systemic medications including oral and injectables.  SEBORRHEIC KERATOSIS - Stuck-on, waxy, tan-brown papules and/or plaques  - Benign-appearing - Discussed benign etiology and prognosis. - Observe - Call for any changes    Return in about 6 months (around 03/23/2024) for TBSE, Psoriasis.  Anise Salvo, RMA, am acting as scribe for Elie Goody, MD .   Documentation: I have reviewed the above documentation for accuracy and completeness, and I agree with the above.  Elie Goody, MD

## 2023-09-21 NOTE — Patient Instructions (Signed)
Reviewed risks of biologics including immunosuppression, infections, injection site reaction, and failure to improve condition. Goal is control of skin condition, not cure.  Some older biologics such as Humira and Enbrel may slightly increase risk of malignancy and may worsen congestive heart failure.  Taltz and Cosentyx may cause inflammatory bowel disease to flare. The use of biologics requires long term medication management, including periodic office visits and monitoring of blood work.     Due to recent changes in healthcare laws, you may see results of your pathology and/or laboratory studies on MyChart before the doctors have had a chance to review them. We understand that in some cases there may be results that are confusing or concerning to you. Please understand that not all results are received at the same time and often the doctors may need to interpret multiple results in order to provide you with the best plan of care or course of treatment. Therefore, we ask that you please give [place] 2 business days to thoroughly review all your results before contacting the office for clarification. Should we see a critical lab result, you will be contacted sooner.   If You Need Anything After Your Visit  If you have any questions or concerns for your doctor, please call our main line at [phone] and press option 4 to reach your doctor's medical assistant. If no one answers, please leave a voicemail as directed and we will return your call as soon as possible. Messages left after 4 pm will be answered the following business day.   You may also send [place] a message via MyChart. We typically respond to MyChart messages within 1-2 business days.  For prescription refills, please ask your pharmacy to contact our office. Our fax number is [phone].  If you have an urgent issue when the clinic is closed that cannot wait until the next business day, you can page your doctor at the number below.     Please note that while we do our best to be available for urgent issues outside of office hours, we are not available 24/7.   If you have an urgent issue and are unable to reach [place], you may choose to seek medical care at your doctor's office, retail clinic, urgent care center, or emergency room.  If you have a medical emergency, please immediately call 911 or go to the emergency department.  Pager Numbers  - Dr. Gwen Pounds: [phone]  - Dr. Roseanne Reno: [phone]  - Dr. Katrinka Blazing: [phone]   In the event of inclement weather, please call our main line at [phone] for an update on the status of any delays or closures.  Dermatology Medication Tips: Please keep the boxes that topical medications come in in order to help keep track of the instructions about where and how to use these. Pharmacies typically print the medication instructions only on the boxes and not directly on the medication tubes.   If your medication is too expensive, please contact our office at [phone] option 4 or send [place] a message through MyChart.   We are unable to tell what your co-pay for medications will be in advance as this is different depending on your insurance coverage. However, we may be able to find a substitute medication at lower cost or fill out paperwork to get insurance to cover a needed medication.   If a prior authorization is required to get your medication covered by your insurance company, please allow [place] 1-2 business days to complete this process.  Drug  prices often vary depending on where the prescription is filled and some pharmacies may offer cheaper prices.  The website www.goodrx.com contains coupons for medications through different pharmacies. The prices here do not account for what the cost may be with help from insurance (it may be cheaper with your insurance), but the website can give you the price if you did not use any insurance.  - You can print the associated coupon and  take it with your prescription to the pharmacy.  - You may also stop by our office during regular business hours and pick up a GoodRx coupon card.  - If you need your prescription sent electronically to a different pharmacy, notify our office through [facility] or by phone at [phone] option 4.     Si Usted Necesita Algo Despus de Su Visita  Tambin puede enviarnos un mensaje a travs de Clinical cytogeneticist. Por lo general respondemos a los mensajes de MyChart en el transcurso de 1 a 2 das hbiles.  Para renovar recetas, por favor pida a su farmacia que se ponga en contacto con nuestra oficina. Annie Sable de fax es [place] [phone].  Si tiene un asunto urgente cuando la clnica est cerrada y que no puede esperar hasta el siguiente da hbil, puede llamar/localizar a su doctor(a) al nmero que aparece a continuacin.   Por favor, tenga en cuenta que aunque hacemos todo lo posible para estar disponibles para asuntos urgentes fuera del horario de [place], no estamos disponibles las 24 horas del da, los 7 [address]  [address] de la [place].   Si tiene un problema urgente y no puede comunicarse con nosotros, puede optar por buscar atencin mdica  en el consultorio de su doctor(a), en una clnica privada, en un centro de atencin urgente o en una sala de emergencias.  Si tiene Engineer, drilling, por favor llame inmediatamente al 911 o vaya a la sala de emergencias.  Nmeros de bper  - Dr. Gwen Pounds: [phone]  - Dra. Roseanne Reno: [phone]  - Dr. Katrinka Blazing: [phone]   En caso de inclemencias del tiempo, por favor llame a Lacy Duverney principal al [phone] para una actualizacin sobre el [place] de cualquier retraso o cierre.  Consejos para la medicacin en dermatologa: Por favor, guarde las cajas en las que vienen los medicamentos de uso tpico para ayudarle a seguir las instrucciones sobre dnde y cmo usarlos. Las farmacias generalmente imprimen las instrucciones del medicamento slo  en las cajas y no directamente en los tubos del [place].   Si su medicamento es muy caro, por favor, pngase en contacto con Rolm Gala llamando al [phone] y presione la opcin 4 o envenos un mensaje a travs de Clinical cytogeneticist.   No podemos decirle cul ser su copago por los medicamentos por adelantado ya que esto es diferente dependiendo de la cobertura de su seguro. Sin embargo, es posible que podamos encontrar un medicamento sustituto a Audiological scientist un formulario para que el seguro cubra el medicamento que se considera necesario.   Si se requiere una autorizacin previa para que su compaa de seguros [place] su medicamento, por favor permtanos de 1 a 2 das hbiles para completar [address]  Los precios de los medicamentos varan con frecuencia dependiendo del Environmental consultant de dnde se surte la receta y alguna farmacias pueden ofrecer precios ms baratos.  El sitio web www.goodrx.com tiene cupones para medicamentos de Health and safety inspector. Los precios aqu no tienen en cuenta lo que podra costar con la ayuda del seguro (puede ser ms  barato con su seguro), pero el sitio web puede darle el precio si no Visual merchandiser.  - Puede imprimir el cupn correspondiente y llevarlo con su receta a la farmacia.  - Tambin puede pasar por nuestra oficina durante el horario de atencin regular y Education officer, museum una tarjeta de cupones de GoodRx.  - Si necesita que su receta se enve electrnicamente a una farmacia diferente, informe a nuestra oficina a travs de MyChart de [place] o por telfono llamando al [phone] y presione la opcin 4.

## 2023-09-25 ENCOUNTER — Encounter: Payer: Self-pay | Admitting: Dermatology

## 2023-09-25 LAB — QUANTIFERON-TB GOLD PLUS
QuantiFERON Mitogen Value: >10 [IU]/mL
QuantiFERON Nil Value: 0.03 [IU]/mL
QuantiFERON TB1 Ag Value: 0.03 [IU]/mL
QuantiFERON TB2 Ag Value: 0.03 [IU]/mL
QuantiFERON-TB Gold Plus: NEGATIVE

## 2023-09-25 LAB — SPECIMEN STATUS REPORT

## 2023-11-27 NOTE — Progress Notes (Signed)
 [facility] [address] [place], [place] [address]  Pulmonary Sleep Medicine   Office Visit Note  Patient Name: Jason Mcdonald DOB: 01-21-1986 MRN [id number]    Chief Complaint: Obstructive Sleep Apnea visit  Brief History:  Jason Mcdonald is seen today for an annual follow up visit for APAP@ 5-20 cmH2O. The patient has a 4 year history of sleep apnea. Patient is using PAP nightly.  The patient feels rested after sleeping with PAP.  The patient reports benefiting from PAP use. Reported sleepiness is  improved and the Epworth Sleepiness Score is 0 out of 24. The patient does not take naps. The patient complains of the following: none.  The compliance download shows 99% compliance with an average use time of 7 hours 47 minutes. The AHI is 0.9.  The patient does not complain of limb movements disrupting sleep. The patient continues to require PAP therapy in order to eliminate sleep apnea.   ROS  General: (-) fever, (-) chills, (-) night sweat Nose and Sinuses: (-) nasal stuffiness or itchiness, (-) postnasal drip, (-) nosebleeds, (-) sinus trouble. Mouth and Throat: (-) sore throat, (-) hoarseness. Neck: (-) swollen glands, (-) enlarged thyroid, (-) neck pain. Respiratory: - cough, - shortness of breath, - wheezing. Neurologic: - numbness, - tingling. Psychiatric: - anxiety, - depression   Current Medication: Outpatient Encounter Medications as of 11/30/2023  Medication Sig   amLODipine-valsartan (EXFORGE) 10-320 MG tablet Take 1 tablet by mouth daily.   benzonatate  (TESSALON ) 100 MG capsule Take 2 capsules (200 mg total) by mouth every 8 (eight) hours.   clobetasol  (TEMOVATE ) 0.05 % external solution Apply to affected skin qd-bid prn, Avoid applying to face, groin, and axilla. Use as directed. Long-term use can cause thinning of the skin.   diphenoxylate -atropine  (LOMOTIL ) 2.5-0.025 MG tablet Take 1 tablet by mouth 4 (four) times daily as needed for diarrhea or loose stools.    fenofibrate (TRICOR) 48 MG tablet Take by mouth.   hydrochlorothiazide (MICROZIDE) 12.5 MG capsule Take 12.5 mg by mouth daily.   ipratropium (ATROVENT ) 0.06 % nasal spray Place 2 sprays into both nostrils 4 (four) times daily.   ketoconazole  (NIZORAL ) 2 % shampoo 3 times a week lather from neck to thighs, leave on 5-10 minutes, rinse well   mometasone  (ELOCON ) 0.1 % lotion Apply topically as directed. Qd up to 5 days a week to aa psoriasis on scalp and legs prn flares   promethazine -dextromethorphan (PROMETHAZINE -DM) 6.25-15 MG/5ML syrup Take 5 mLs by mouth 4 (four) times daily as needed.   risankizumab -rzaa (SKYRIZI  PEN) 150 MG/ML pen Every 12 weeks for maintenance.   Roflumilast  (ZORYVE ) 0.3 % CREA Apply to affected skin once a day   No facility-administered encounter medications on file as of 11/30/2023.    Surgical History: Past Surgical History:  Procedure Laterality Date   ORIF CLAVICLE FRACTURE      Medical History: Past Medical History:  Diagnosis Date   Hypercholesterolemia    Hypertension    Psoriasis     Family History: Non contributory to the present illness  Social History: Social History   Socioeconomic History   Marital status: Single    Spouse name: Not on file   Number of children: Not on file   Years of education: Not on file   Highest education level: Not on file  Occupational History   Not on file  Tobacco Use   Smoking status: Never   Smokeless tobacco: Never  Vaping Use   Vaping status:  Never Used  Substance and Sexual Activity   Alcohol use: Not Currently   Drug use: Never   Sexual activity: Not on file  Other Topics Concern   Not on file  Social History Narrative   Not on file   Social Drivers of Health   Financial Resource Strain: Low Risk  (08/10/2023)   Received from [facility]   Overall Financial Resource Strain (CARDIA)    Difficulty of Paying Living Expenses: Not hard at all  Food Insecurity: No Food  Insecurity (08/10/2023)   Received from [facility]   Hunger Vital Sign    Worried About Running Out of Food in the Last Year: Never true    Ran Out of Food in the Last Year: Never true  Transportation Needs: No Transportation Needs (08/10/2023)   Received from [facility] - Transportation    In the past 12 months, has lack of transportation kept you from medical appointments or from getting medications?: No    Lack of Transportation (Non-Medical): No  Physical Activity: Not on file  Stress: Not on file  Social Connections: Not on file  Intimate Partner Violence: Not on file    Vital Signs: Blood pressure (!) 157/107, pulse 76, resp. rate 16, height 5\' 10"  (1.778 m), weight 266 lb (120.7 kg), SpO2 96%. Body mass index is 38.17 kg/m.    Examination: General Appearance: The patient is well-developed, well-nourished, and in no distress. Neck Circumference: 48 cm Skin: Gross inspection of skin unremarkable. Head: normocephalic, no gross deformities. Eyes: no gross deformities noted. ENT: ears appear grossly normal Neurologic: Alert and oriented. No involuntary movements.  STOP BANG RISK ASSESSMENT S (snore) Have you been told that you snore?     NO   T (tired) Are you often tired, fatigued, or sleepy during the day?   NO  O (obstruction) Do you stop breathing, choke, or gasp during sleep? NO   P (pressure) Do you have or are you being treated for high blood pressure? YES   B (BMI) Is your body index greater than 35 kg/m? YES   A (age) Are you 43 years old or older? NO   N (neck) Do you have a neck circumference greater than 16 inches?   YES   G (gender) Are you a male? YES   TOTAL STOP/BANG "YES" ANSWERS 4       A STOP-Bang score of 2 or less is considered low risk, and a score of 5 or more is high risk for having either moderate or severe OSA. For people who score 3 or 4, doctors may need to perform further assessment to  determine how likely they are to have OSA.         EPWORTH SLEEPINESS SCALE:  Scale:  (0)= no chance of dozing; (1)= slight chance of dozing; (2)= moderate chance of dozing; (3)= high chance of dozing  Chance  Situtation    Sitting and reading: 0    Watching TV: 0    Sitting Inactive in public: 0    As a passenger in car: 0      Lying down to rest: 0    Sitting and talking: 0    Sitting quielty after lunch: 0    In a car, stopped in traffic: 0   TOTAL SCORE:   0 out of 24    SLEEP STUDIES:  HST (06/08/19) RDI 27.7, min SPO2 81%    CPAP COMPLIANCE  DATA:  Date Range: 11/27/2022-11/26/2023  Average Daily Use: 7 hours 47 minutes  Median Use: 7 hours 52 minutes  Compliance for > 4 Hours: 99%  AHI: 0.9 respiratory events per hour  Days Used: 365/365 days  Mask Leak: 39.1  95th Percentile Pressure: 10.3         LABS: Recent Results (from the past 2160 hours)  QuantiFERON-TB Gold Plus     Status: None   Collection Time: 09/21/23 12:00 AM  Result Value Ref Range   QuantiFERON Incubation Incubation performed.    QuantiFERON Criteria Comment     Comment: QuantiFERON-TB Gold Plus is a qualitative indirect test for M tuberculosis infection (including disease) and is intended for use in conjunction with risk assessment, radiography, and other medical and diagnostic evaluations. The QuantiFERON-TB Gold Plus result is determined by subtracting the Nil value from either TB antigen (Ag) value. The Mitogen tube serves as a control for the test.    QuantiFERON TB1 Ag Value 0.03 IU/mL   QuantiFERON TB2 Ag Value 0.03 IU/mL   QuantiFERON Nil Value 0.03 IU/mL   QuantiFERON Mitogen Value >10.00 IU/mL   QuantiFERON-TB Gold Plus Negative Negative    Comment: No response to M tuberculosis antigens detected. Infection with M tuberculosis is unlikely, but high risk individuals should be considered for additional testing (ATS/IDSA/CDC Clinical Practice Guidelines,  2017). The reference range is an Antigen minus Nil result of <0.35 IU/mL. Chemiluminescence immunoassay methodology   Specimen status report     Status: None   Collection Time: 09/21/23 12:00 AM  Result Value Ref Range   specimen status report Comment     Comment: Please note Please note The date and/or time of collection was not indicated on the requisition as required by state and federal law.  The date of receipt of the specimen was used as the collection date if not supplied.     Radiology: DG Abdomen 1 View Result Date: 02/03/2023 CLINICAL DATA:  Intermittent pain left lower quadrant of abdomen, diarrhea EXAM: ABDOMEN - 1 VIEW COMPARISON:  None Available. FINDINGS: Bowel gas pattern is nonspecific. Small to moderate amount of stool is seen in proximal colon. There is no fecal impaction in rectum. No abnormal masses or calcifications are seen. Visualized bony structures are unremarkable. IMPRESSION: Nonspecific bowel gas pattern. Electronically Signed   By: Craven Do M.D.   On: 02/03/2023 10:18    No results found.  No results found.    Assessment and Plan: Patient Active Problem List   Diagnosis Date Noted   Hypertriglyceridemia 12/21/2020   OSA on CPAP 12/17/2020   CPAP use counseling 12/17/2020   OSA (obstructive sleep apnea) 08/27/2020   Essential hypertension 08/27/2020   Obesity (BMI 35.0-39.9 without comorbidity) 08/27/2020   Psoriasis 04/01/2019   Hypertension, uncontrolled 12/15/2014   1. OSA on CPAP (Primary) The patient does tolerate PAP and reports  benefit from PAP use. The patient was reminded how to clean equipment and advised to replace supplies routinely. The patient was also counselled on weight loss. The compliance is excellent. The AHI is 0.9.   OSA on cpap- controlled. Continue with excellent compliance with pap. CPAP continues to be medically necessary to treat this patient's OSA. F/u one year.     2. CPAP use counseling CPAP  Counseling: had a lengthy discussion with the patient regarding the importance of PAP therapy in management of the sleep apnea. Patient appears to understand the risk factor reduction and also understands the risks associated with untreated  sleep apnea. Patient will try to make a good faith effort to remain compliant with therapy. Also instructed the patient on proper cleaning of the device including the water must be changed daily if possible and use of distilled water is preferred. Patient understands that the machine should be regularly cleaned with appropriate recommended cleaning solutions that do not damage the PAP machine for example given white vinegar and water rinses. Other methods such as ozone treatment may not be as good as these simple methods to achieve cleaning.   3. Essential hypertension Elevated today. He did not take his medication. Denies symptoms. He will go home and take his medication and monitor his blood pressure.   4. Obesity (BMI 35.0-39.9 without comorbidity) Obesity Counseling: Had a lengthy discussion regarding patients BMI and weight issues. Patient was instructed on portion control as well as increased activity. Also discussed caloric restrictions with trying to maintain intake less than 2000 Kcal. Discussions were made in accordance with the 5As of weight management. Simple actions such as not eating late and if able to, taking a walk is suggested.      General Counseling: I have discussed the findings of the evaluation and examination with [facility].  I have also discussed any further diagnostic evaluation thatmay be needed or ordered today. Oakley verbalizes understanding of the findings of todays visit. We also reviewed his medications today and discussed drug interactions and side effects including but not limited excessive drowsiness and altered mental states. We also discussed that there is always a risk not just to him but also people around him. he has been encouraged  to call the office with any questions or concerns that should arise related to todays visit.  No orders of the defined types were placed in this encounter.       I have personally obtained a history, examined the patient, evaluated laboratory and imaging results, formulated the assessment and plan and placed orders. This patient was seen today by Louann Rous, PA-C in collaboration with Dr. Cam Cava.   Cordie Deters, MD [facility] Diplomate ABMS Pulmonary Critical Care Medicine and Sleep Medicine

## 2023-11-30 ENCOUNTER — Ambulatory Visit (INDEPENDENT_AMBULATORY_CARE_PROVIDER_SITE_OTHER): Admitting: Internal Medicine

## 2023-11-30 VITALS — BP 157/107 | HR 76 | Resp 16 | Ht 70.0 in | Wt 266.0 lb

## 2023-11-30 DIAGNOSIS — Z7189 Other specified counseling: Secondary | ICD-10-CM

## 2023-11-30 DIAGNOSIS — G4733 Obstructive sleep apnea (adult) (pediatric): Secondary | ICD-10-CM | POA: Diagnosis not present

## 2023-11-30 DIAGNOSIS — I10 Essential (primary) hypertension: Secondary | ICD-10-CM

## 2023-11-30 DIAGNOSIS — E669 Obesity, unspecified: Secondary | ICD-10-CM | POA: Diagnosis not present

## 2023-11-30 NOTE — Patient Instructions (Signed)

## 2024-03-09 ENCOUNTER — Ambulatory Visit (INDEPENDENT_AMBULATORY_CARE_PROVIDER_SITE_OTHER): Payer: BC Managed Care – PPO | Admitting: Dermatology

## 2024-03-09 ENCOUNTER — Encounter: Payer: Self-pay | Admitting: Dermatology

## 2024-03-09 DIAGNOSIS — L578 Other skin changes due to chronic exposure to nonionizing radiation: Secondary | ICD-10-CM | POA: Diagnosis not present

## 2024-03-09 DIAGNOSIS — W908XXA Exposure to other nonionizing radiation, initial encounter: Secondary | ICD-10-CM

## 2024-03-09 DIAGNOSIS — Z1283 Encounter for screening for malignant neoplasm of skin: Secondary | ICD-10-CM

## 2024-03-09 DIAGNOSIS — L814 Other melanin hyperpigmentation: Secondary | ICD-10-CM

## 2024-03-09 DIAGNOSIS — L821 Other seborrheic keratosis: Secondary | ICD-10-CM

## 2024-03-09 DIAGNOSIS — L409 Psoriasis, unspecified: Secondary | ICD-10-CM

## 2024-03-09 DIAGNOSIS — D229 Melanocytic nevi, unspecified: Secondary | ICD-10-CM

## 2024-03-09 DIAGNOSIS — Z79899 Other long term (current) drug therapy: Secondary | ICD-10-CM

## 2024-03-09 DIAGNOSIS — Z7189 Other specified counseling: Secondary | ICD-10-CM

## 2024-03-09 DIAGNOSIS — D1801 Hemangioma of skin and subcutaneous tissue: Secondary | ICD-10-CM

## 2024-03-09 MED ORDER — SKYRIZI PEN 150 MG/ML ~~LOC~~ SOAJ: SUBCUTANEOUS | 3 refills | Status: DC

## 2024-03-09 NOTE — Patient Instructions (Signed)
 Continue Skyrizi  150 mg every 3 months as directed.   Reviewed risks of biologics including immunosuppression, infections (i.e. TB reactivation), injection site reaction, and failure to improve condition. Goal is control of skin condition, not cure.  Some older biologics such as Humira and Enbrel may slightly increase risk of malignancy and may worsen congestive heart failure.  Taltz, Cosentyx, and Bimzelx may cause inflammatory bowel disease to flare or may increase incidence of yeast infections. Skyrizi , Tremfya, and Stelara  may also slightly increase risk of infection. The use of biologics requires long term medication management, including periodic office visits, annual TB screening test and monitoring of blood work.     Due to recent changes in healthcare laws, you may see results of your pathology and/or laboratory studies on MyChart before the doctors have had a chance to review them. We understand that in some cases there may be results that are confusing or concerning to you. Please understand that not all results are received at the same time and often the doctors may need to interpret multiple results in order to provide you with the best plan of care or course of treatment. Therefore, we ask that you please give us  2 business days to thoroughly review all your results before contacting the office for clarification. Should we see a critical lab result, you will be contacted sooner.   If You Need Anything After Your Visit  If you have any questions or concerns for your doctor, please call our main line at [phone] and press option 4 to reach your doctor's medical assistant. If no one answers, please leave a voicemail as directed and we will return your call as soon as possible. Messages left after 4 pm will be answered the following business day.   You may also send us  a message via MyChart. We typically respond to MyChart messages within 1-2 business days.  For prescription refills,  please ask your pharmacy to contact our office. Our fax number is [phone].  If you have an urgent issue when the clinic is closed that cannot wait until the next business day, you can page your doctor at the number below.    Please note that while we do our best to be available for urgent issues outside of office hours, we are not available 24/7.   If you have an urgent issue and are unable to reach us , you may choose to seek medical care at your doctor's office, retail clinic, urgent care center, or emergency room.  If you have a medical emergency, please immediately call 911 or go to the emergency department.  Pager Numbers  - Dr. Hester: [phone]  - Dr. Jackquline: [phone]  - Dr. Claudene: [phone]   - Dr. Raymund: [phone]  In the event of inclement weather, please call our main line at [phone] for an update on the status of any delays or closures.  Dermatology Medication Tips: Please keep the boxes that topical medications come in in order to help keep track of the instructions about where and how to use these. Pharmacies typically print the medication instructions only on the boxes and not directly on the medication tubes.   If your medication is too expensive, please contact our office at [phone] option 4 or send us  a message through MyChart.   We are unable to tell what your co-pay for medications will be in advance as this is different depending on your insurance coverage. However, we may be able to find a substitute medication at lower  cost or fill out paperwork to get insurance to cover a needed medication.   If a prior authorization is required to get your medication covered by your insurance company, please allow us  1-2 business days to complete this process.  Drug prices often vary depending on where the prescription is filled and some pharmacies may offer cheaper prices.  The website www.goodrx.com contains coupons for medications through  different pharmacies. The prices here do not account for what the cost may be with help from insurance (it may be cheaper with your insurance), but the website can give you the price if you did not use any insurance.  - You can print the associated coupon and take it with your prescription to the pharmacy.  - You may also stop by our office during regular business hours and pick up a GoodRx coupon card.  - If you need your prescription sent electronically to a different pharmacy, notify our office through [facility] or by phone at [phone] option 4.     Si Usted Necesita Algo Despus de Su Visita  Tambin puede enviarnos un mensaje a travs de Clinical cytogeneticist. Por lo general respondemos a los mensajes de MyChart en el transcurso de 1 a 2 das hbiles.  Para renovar recetas, por favor pida a su farmacia que se ponga en contacto con nuestra oficina. Randi lakes de fax es [place] [phone].  Si tiene un asunto urgente cuando la clnica est cerrada y que no puede esperar hasta el siguiente da hbil, puede llamar/localizar a su doctor(a) al nmero que aparece a continuacin.   Por favor, tenga en cuenta que aunque hacemos todo lo posible para estar disponibles para asuntos urgentes fuera del horario de [place], no estamos disponibles las 24 horas del da, los 7 [address]  [address] de la [place].   Si tiene un problema urgente y no puede comunicarse con nosotros, puede optar por buscar atencin mdica  en el consultorio de su doctor(a), en una clnica privada, en un centro de atencin urgente o en una sala de emergencias.  Si tiene Engineer, drilling, por favor llame inmediatamente al 911 o vaya a la sala de emergencias.  Nmeros de bper  - Dr. Hester: [phone]  - Dra. Jackquline: [phone]  - Dr. Claudene: [phone]  - Dra. Kitts: [phone]  En caso de inclemencias del [place], por favor llame a nuestra lnea principal al [phone] para una actualizacin sobre el estado de cualquier  retraso o cierre.  Consejos para la medicacin en dermatologa: Por favor, guarde las cajas en las que vienen los medicamentos de uso tpico para ayudarle a seguir las instrucciones sobre dnde y cmo usarlos. Las farmacias generalmente imprimen las instrucciones del medicamento slo en las cajas y no directamente en los tubos del [place].   Si su medicamento es muy caro, por favor, pngase en contacto con landry rieger llamando al [phone] y presione la opcin 4 o envenos un mensaje a travs de Clinical cytogeneticist.   No podemos decirle cul ser su copago por los medicamentos por adelantado ya que esto es diferente dependiendo de la cobertura de su seguro. Sin embargo, es posible que podamos encontrar un medicamento sustituto a Audiological scientist un formulario para que el seguro cubra el medicamento que se considera necesario.   Si se requiere una autorizacin previa para que su compaa de seguros malta su medicamento, por favor permtanos de 1 a 2 das hbiles para completar este proceso.  Los precios de los medicamentos varan con frecuencia dependiendo del  lugar de dnde se surte la receta y alguna farmacias pueden ofrecer precios ms baratos.  El sitio web www.goodrx.com tiene cupones para medicamentos de Health and safety inspector. Los precios aqu no tienen en cuenta lo que podra costar con la ayuda del seguro (puede ser ms barato con su seguro), pero el sitio web puede darle el precio si no utiliz Tourist information centre manager.  - Puede imprimir el cupn correspondiente y llevarlo con su receta a la farmacia.  - Tambin puede pasar por nuestra oficina durante el horario de atencin regular y Education officer, museum una tarjeta de cupones de GoodRx.  - Si necesita que su receta se enve electrnicamente a una farmacia diferente, informe a nuestra oficina a travs de MyChart de [place] o por telfono llamando al [phone] y presione la opcin 4.

## 2024-03-09 NOTE — Progress Notes (Signed)
 Follow-Up Visit   Subjective  Jason Mcdonald is a 38 y.o. male who presents for the following: Psoriasis. Started Skyrizi  April 2025. States he is doing well on this treatment. Tolerating well. Denies adverse reactions or injection site reactions.   States customer service with Skyrizi  is terrible.   The patient presents for Total-Body Skin Exam (TBSE) for skin cancer screening and mole check.  The patient has spots, moles and lesions to be evaluated, some may be new or changing and the patient has concerns that these could be cancer.  No personal Hx of skin cancer or dysplastic nevi.   The following portions of the chart were reviewed this encounter and updated as appropriate: medications, allergies, medical history  Review of Systems:  No other skin or systemic complaints except as noted in HPI or Assessment and Plan.  Objective  Well appearing patient in no apparent distress; mood and affect are within normal limits.  Areas Examined: Scalp, head, torso, arms, legs, hands, feet, buttocks, groin Total-body skin exam and skin cancer screening performed today.  Relevant exam findings are noted in the Assessment and Plan.   Assessment & Plan   LENTIGINES, SEBORRHEIC KERATOSES, HEMANGIOMAS - Benign normal skin lesions - Benign-appearing - Call for any changes  MELANOCYTIC NEVI - Tan-brown and/or pink-flesh-colored symmetric macules and papules - Benign appearing on exam today - Observation - Call clinic for new or changing moles - Recommend daily use of broad spectrum spf 30+ sunscreen to sun-exposed areas.   ACTINIC DAMAGE - Chronic condition, secondary to cumulative UV/sun exposure - diffuse scaly erythematous macules with underlying dyspigmentation - Recommend daily broad spectrum sunscreen SPF 30+ to sun-exposed areas, reapply every 2 hours as needed.  - Staying in the shade or wearing long sleeves, sun glasses (UVA+UVB protection) and wide brim hats (4-inch brim around  the entire circumference of the hat) are also recommended for sun protection.  - Call for new or changing lesions.   SKIN CANCER SCREENING PERFORMED TODAY    PSORIASIS   Related Medications mometasone  (ELOCON ) 0.1 % lotion Apply topically as directed. Qd up to 5 days a week to aa psoriasis on scalp and legs prn flares risankizumab -rzaa (SKYRIZI  PEN) 150 MG/ML pen Every 12 weeks for maintenance. LONG-TERM USE OF HIGH-RISK MEDICATION   Related Medications risankizumab -rzaa (SKYRIZI  PEN) 150 MG/ML pen Every 12 weeks for maintenance. COUNSELING AND COORDINATION OF CARE   MEDICATION MANAGEMENT   ACTINIC SKIN DAMAGE   LENTIGO   MELANOCYTIC NEVUS, UNSPECIFIED LOCATION    PSORIASIS on systemic treatment Well-demarcated erythematous papules/plaques with silvery scale, guttate pink scaly papules knees. 3% BSA.  Chronic and persistent condition with duration or expected duration over one year. Condition is improving with treatment but not currently at goal.  Reviewed TB from 09/21/2023. Reviewed labs from 07/17/2023.   Counseling and coordination of care for severe psoriasis on systemic treatment  Psoriasis - severe on systemic treatment.  Psoriasis is a chronic non-curable, but treatable genetic/hereditary disease that may have other systemic features affecting other organ systems such as joints (Psoriatic Arthritis).  It is linked with heart disease, inflammatory bowel disease, non-alcoholic fatty liver disease, and depression. Significant skin psoriasis and/or psoriatic arthritis may have significant symptoms and affects activities of daily activity and often benefits from systemic treatments.  These systemic treatments have some potential side effects including immunosuppression and require pre-treatment laboratory screening and periodic laboratory monitoring and periodic in person evaluation and monitoring by the attending dermatologist physician (long term medication  management).   Patient denies joint pain  Treatment Plan: Continue Skyrizi  150 mg every 3 months as directed.   Reviewed risks of biologics including immunosuppression, infections (i.e. TB reactivation), injection site reaction, and failure to improve condition. Goal is control of skin condition, not cure.  Some older biologics such as Humira and Enbrel may slightly increase risk of malignancy and may worsen congestive heart failure.  Taltz, Cosentyx, and Bimzelx may cause inflammatory bowel disease to flare or may increase incidence of yeast infections. Skyrizi , Tremfya, and Stelara  may also slightly increase risk of infection. The use of biologics requires long term medication management, including periodic office visits, annual TB screening test and monitoring of blood work.   Long term medication management.  Patient is using long term (months to years) prescription medication  to control their dermatologic condition.  These medications require periodic monitoring to evaluate for efficacy and side effects and may require periodic laboratory monitoring.   Return in about 6 months (around 09/06/2024) for Psoriasis Follow Up.  I, Kate Fought, CMA, am acting as scribe for Alm Rhyme, MD.   Documentation: I have reviewed the above documentation for accuracy and completeness, and I agree with the above.  Alm Rhyme, MD

## 2024-07-01 ENCOUNTER — Encounter: Payer: Self-pay | Admitting: Emergency Medicine

## 2024-07-01 ENCOUNTER — Ambulatory Visit
Admission: EM | Admit: 2024-07-01 | Discharge: 2024-07-01 | Disposition: A | Discharge status: Home / Self Care | Attending: Family Medicine | Admitting: Family Medicine

## 2024-07-01 DIAGNOSIS — H66002 Acute suppurative otitis media without spontaneous rupture of ear drum, left ear: Secondary | ICD-10-CM

## 2024-07-01 DIAGNOSIS — H6991 Unspecified Eustachian tube disorder, right ear: Secondary | ICD-10-CM

## 2024-07-01 DIAGNOSIS — J069 Acute upper respiratory infection, unspecified: Secondary | ICD-10-CM

## 2024-07-01 MED ORDER — FLUTICASONE PROPIONATE 50 MCG/ACT NA SUSP: 1.0000 | Freq: Every day | NASAL | 0 refills | Status: AC

## 2024-07-01 MED ORDER — AMOXICILLIN 875 MG PO TABS: 875.0000 mg | ORAL_TABLET | Freq: Two times a day (BID) | ORAL | 0 refills | Status: AC

## 2024-07-01 NOTE — Discharge Instructions (Addendum)
 Start amoxicillin  twice daily for 10 days.  Also start Flonase daily.  Lots of rest and fluids and follow-up with your PCP in 2 to 3 days for recheck.  Please go to the ER for any worsening symptoms.  Hope you feel better soon!

## 2024-07-01 NOTE — ED Triage Notes (Signed)
 Patient reports nasal and sinus congestion for about a week.  Patient reports left ear pain that started last night.  Patient reports pain is worse when he is lying down.  Patient denies fevers.

## 2024-07-01 NOTE — ED Provider Notes (Signed)
 " [facility]    CSN: [id number] Arrival date & time: 07/01/24  9188      History   Chief Complaint Chief Complaint  Patient presents with   Nasal Congestion   Otalgia    left    HPI Jason Mcdonald is a 39 y.o. male  presents for evaluation of URI symptoms for 4-5 days. Patient reports associated symptoms of cough, congestion and left ear pain that started last night. Denies N/V/D, fevers, sore throat, body aches, shortness of breath. Patient does not have a hx of asthma. Patient is not an active smoker.   Reports no known sick contacts.  Pt has taken cold medicine OTC for symptoms. Pt has no other concerns at this time.    Otalgia Associated symptoms: congestion and cough     Past Medical History:  Diagnosis Date   Hypercholesterolemia    Hypertension    Psoriasis     Patient Active Problem List   Diagnosis Date Noted   Hypertriglyceridemia 12/21/2020   OSA on CPAP 12/17/2020   CPAP use counseling 12/17/2020   OSA (obstructive sleep apnea) 08/27/2020   Essential hypertension 08/27/2020   Obesity (BMI 35.0-39.9 without comorbidity) 08/27/2020   Psoriasis 04/01/2019   Hypertension, uncontrolled 12/15/2014    Past Surgical History:  Procedure Laterality Date   ORIF CLAVICLE FRACTURE         Home Medications    Prior to Admission medications  Medication Sig Start Date End Date Taking? Authorizing Provider  amoxicillin  (AMOXIL ) 875 MG tablet Take 1 tablet (875 mg total) by mouth 2 (two) times daily for 10 days. 07/01/24 07/11/24 Yes Ayako Tapanes, Jodi R, NP  fluticasone (FLONASE) 50 MCG/ACT nasal spray Place 1 spray into both nostrils daily. 07/01/24  Yes Gracelynn Bircher, Jodi R, NP  amLODipine-valsartan (EXFORGE) 10-320 MG tablet Take 1 tablet by mouth daily. 02/14/21   [provider]  benzonatate  (TESSALON ) 100 MG capsule Take 2 capsules (200 mg total) by mouth every 8 (eight) hours. 05/04/21   Bernardino Ditch, NP  clobetasol  (TEMOVATE ) 0.05 % external solution  Apply to affected skin qd-bid prn, Avoid applying to face, groin, and axilla. Use as directed. Long-term use can cause thinning of the skin. 06/04/22   Hester Alm BROCKS, MD  diphenoxylate -atropine  (LOMOTIL ) 2.5-0.025 MG tablet Take 1 tablet by mouth 4 (four) times daily as needed for diarrhea or loose stools. 02/02/19   Cook, Jayce G, DO  fenofibrate (TRICOR) 48 MG tablet Take by mouth. 12/21/20 02/03/23  [provider]  hydrochlorothiazide (MICROZIDE) 12.5 MG capsule Take 12.5 mg by mouth daily.    [provider]  ipratropium (ATROVENT ) 0.06 % nasal spray Place 2 sprays into both nostrils 4 (four) times daily. 05/04/21   Bernardino Ditch, NP  ketoconazole  (NIZORAL ) 2 % shampoo 3 times a week lather from neck to thighs, leave on 5-10 minutes, rinse well 10/31/20   Hester Alm BROCKS, MD  mometasone  (ELOCON ) 0.1 % lotion Apply topically as directed. Qd up to 5 days a week to aa psoriasis on scalp and legs prn flares 05/01/21   Hester Alm BROCKS, MD  promethazine -dextromethorphan (PROMETHAZINE -DM) 6.25-15 MG/5ML syrup Take 5 mLs by mouth 4 (four) times daily as needed. 05/04/21   Bernardino Ditch, NP  risankizumab -rzaa (SKYRIZI  PEN) 150 MG/ML pen Every 12 weeks for maintenance. 03/09/24   Hester Alm BROCKS, MD  Roflumilast  (ZORYVE ) 0.3 % CREA Apply to affected skin once a day 02/16/23   Hester Alm BROCKS, MD    Family  History Family History  Problem Relation Age of Onset   Diabetes Mother     Social History Social History[1]   Allergies   Patient has no known allergies.   Review of Systems Review of Systems  HENT:  Positive for congestion and ear pain.   Respiratory:  Positive for cough.      Physical Exam Triage Vital Signs ED Triage Vitals  Encounter Vitals Group     BP 07/01/24 0823 (!) 159/108     Girls Systolic BP Percentile --      Girls Diastolic BP Percentile --      Boys Systolic BP Percentile --      Boys Diastolic BP Percentile --      Pulse Rate 07/01/24 0823 76      Resp --      Temp 07/01/24 0823 98.3 F (36.8 C)     Temp Source 07/01/24 0823 Oral     SpO2 07/01/24 0823 95 %     Weight 07/01/24 0820 270 lb (122.5 kg)     Height 07/01/24 0820 5' 10 (1.778 m)     Head Circumference --      Peak Flow --      Pain Score 07/01/24 0820 2     Pain Loc --      Pain Education --      Exclude from Growth Chart --    No data found.  Updated Vital Signs BP (!) 159/108 (BP Location: Right Arm) Comment: Patient has not taken BP medicine today  Pulse 76   Temp 98.3 F (36.8 C) (Oral)   Ht 5' 10 (1.778 m)   Wt 270 lb (122.5 kg)   SpO2 95%   BMI 38.74 kg/m   Visual Acuity Right Eye Distance:   Left Eye Distance:   Bilateral Distance:    Right Eye Near:   Left Eye Near:    Bilateral Near:     Physical Exam Vitals and nursing note reviewed.  Constitutional:      General: He is not in acute distress.    Appearance: Normal appearance. He is not ill-appearing or toxic-appearing.  HENT:     Head: Normocephalic and atraumatic.     Right Ear: Ear canal normal. A middle ear effusion is present.     Left Ear: Ear canal normal. Tympanic membrane is erythematous.     Nose: Congestion present.     Mouth/Throat:     Mouth: Mucous membranes are moist.     Pharynx: No oropharyngeal exudate or posterior oropharyngeal erythema.  Eyes:     Pupils: Pupils are equal, round, and reactive to light.  Cardiovascular:     Rate and Rhythm: Normal rate and regular rhythm.     Heart sounds: Normal heart sounds.  Pulmonary:     Effort: Pulmonary effort is normal.     Breath sounds: Normal breath sounds.  Musculoskeletal:     Cervical back: Normal range of motion and neck supple.  Lymphadenopathy:     Cervical: No cervical adenopathy.  Skin:    General: Skin is warm and dry.  Neurological:     General: No focal deficit present.     Mental Status: He is alert and oriented to person, place, and time.  Psychiatric:        Mood and Affect: Mood normal.         Behavior: Behavior normal.      UC Treatments / Results  Labs (all labs ordered are listed,  but only abnormal results are displayed) Labs Reviewed - No data to display  EKG   Radiology No results found.  Procedures Procedures (including critical care time)  Medications Ordered in UC Medications - No data to display  Initial Impression / Assessment and Plan / UC Course  I have reviewed the triage vital signs and the nursing notes.  Pertinent labs & imaging results that were available during my care of the patient were reviewed by me and considered in my medical decision making (see chart for details).     Reviewed exam and symptoms with patient.  No red flags.  Start amoxicillin  for left OM.  Flonase daily.  Encourage rest fluids and PCP follow-up 2 to 3 days for recheck.  ER precautions reviewed. Final Clinical Impressions(s) / UC Diagnoses   Final diagnoses:  Acute suppurative otitis media of left ear without spontaneous rupture of tympanic membrane, recurrence not specified  Viral upper respiratory tract infection  Eustachian tube dysfunction, right     Discharge Instructions      Start amoxicillin  twice daily for 10 days.  Also start Flonase daily.  Lots of rest and fluids and follow-up with your PCP in 2 to 3 days for recheck.  Please go to the ER for any worsening symptoms.  Hope you feel better soon!    ED Prescriptions     Medication Sig Dispense Auth. Provider   fluticasone (FLONASE) 50 MCG/ACT nasal spray Place 1 spray into both nostrils daily. 15.8 mL Ermin Parisien, Jodi R, NP   amoxicillin  (AMOXIL ) 875 MG tablet Take 1 tablet (875 mg total) by mouth 2 (two) times daily for 10 days. 20 tablet Kartel Wolbert, Jodi R, NP      PDMP not reviewed this encounter.    [1]  Social History Tobacco Use   Smoking status: Never   Smokeless tobacco: Never  Vaping Use   Vaping status: Never Used  Substance Use Topics   Alcohol use: Not Currently   Drug use: Never      Loreda Myla SAUNDERS, NP 07/01/24 [phone]  "

## 2024-07-05 ENCOUNTER — Other Ambulatory Visit: Payer: Self-pay | Admitting: Dermatology

## 2024-07-05 DIAGNOSIS — L409 Psoriasis, unspecified: Secondary | ICD-10-CM

## 2024-07-05 DIAGNOSIS — Z79899 Other long term (current) drug therapy: Secondary | ICD-10-CM

## 2024-09-07 ENCOUNTER — Ambulatory Visit: Admitting: Dermatology
